# Patient Record
Sex: Female | Born: 1940 | Hispanic: Yes | Marital: Married | Smoking: Never smoker
Health system: Southern US, Community
[De-identification: ages and names within clinical notes are randomized; demographics above are authoritative.]

## PROBLEM LIST (undated history)

## (undated) DIAGNOSIS — F0390 Unspecified dementia without behavioral disturbance: Secondary | ICD-10-CM

---

## 2017-09-01 ENCOUNTER — Emergency Department
Admission: EM | Admit: 2017-09-01 | Discharge: 2017-09-01 | Disposition: A | Discharge status: Home / Self Care | Payer: Medicare Other | Attending: Emergency Medicine | Admitting: Emergency Medicine

## 2017-09-01 ENCOUNTER — Encounter: Payer: Self-pay | Admitting: Emergency Medicine

## 2017-09-01 DIAGNOSIS — R319 Hematuria, unspecified: Secondary | ICD-10-CM

## 2017-09-01 DIAGNOSIS — N390 Urinary tract infection, site not specified: Secondary | ICD-10-CM | POA: Insufficient documentation

## 2017-09-01 LAB — URINALYSIS, COMPLETE (UACMP) WITH MICROSCOPIC
BILIRUBIN URINE: NEGATIVE
Glucose, UA: NEGATIVE mg/dL
Ketones, ur: NEGATIVE mg/dL
Nitrite: NEGATIVE
Protein, ur: NEGATIVE mg/dL
RBC / HPF: >50 RBC/hpf — ABNORMAL HIGH (ref 0–5)
Specific Gravity, Urine: 1.005 (ref 1.005–1.030)
WBC, UA: >50 WBC/hpf — ABNORMAL HIGH (ref 0–5)
pH: 8 (ref 5.0–8.0)

## 2017-09-01 MED ORDER — CEPHALEXIN 500 MG PO CAPS: 500.0000 mg | ORAL_CAPSULE | Freq: Three times a day (TID) | ORAL | 0 refills | Status: AC

## 2017-09-01 MED ORDER — CEPHALEXIN 500 MG PO CAPS
500.0000 mg | ORAL_CAPSULE | Freq: Once | ORAL | Status: AC
  Administered 2017-09-01: 500 mg via ORAL
  Filled 2017-09-01: qty 1

## 2017-09-01 NOTE — ED Provider Notes (Signed)
[facility] Emergency Department Provider Note ____________________________________________   First MD Initiated Contact with Patient 09/01/17 1357     (approximate)  I have reviewed the triage vital signs and the nursing notes.   HISTORY  Chief Complaint Hematuria  HPI Rachel Yang is a 77 y.o. female with a history of high cholesterol as well as kidney stones was presented to the emergency department with 1 day of hematuria as well as burning with urination.  She denies any pain at this time but says that there is burning pain when she urinates.  She does not report fever, nausea or vomiting.  Denies any history of previous UTI.  No history of diabetes.  Says that she just had a small amount of blood on the toilet this yesterday and otherwise has not any further bleeding.  History reviewed. No pertinent past medical history.  There are no active problems to display for this patient.   History reviewed. No pertinent surgical history.  Prior to Admission medications   Not on File    Allergies Patient has no allergy information on record.  No family history on file.  Social History Social History   Tobacco Use  . Smoking status: Not on file  Substance Use Topics  . Alcohol use: Not on file  . Drug use: Not on file    Review of Systems  Constitutional: No fever/chills Eyes: No visual changes. ENT: No sore throat. Cardiovascular: Denies chest pain. Respiratory: Denies shortness of breath. Gastrointestinal:  No nausea, no vomiting.  No diarrhea.  No constipation. Genitourinary: As above  musculoskeletal: Negative for back pain. Skin: Negative for rash. Neurological: Negative for headaches, focal weakness or numbness.   ____________________________________________   PHYSICAL EXAM:  VITAL SIGNS: ED Triage Vitals  Enc Vitals Group     BP 09/01/17 1303 111/72     Pulse Rate 09/01/17 1303 67     Resp 09/01/17 1303 18     Temp  09/01/17 1303 98.6 F (37 C)     Temp Source 09/01/17 1303 Oral     SpO2 09/01/17 1303 98 %     Weight 09/01/17 1304 132 lb (59.9 kg)     Height 09/01/17 1304 5\' 6"  (1.676 m)     Head Circumference --      Peak Flow --      Pain Score 09/01/17 1304 7     Pain Loc --      Pain Edu? --      Excl. in GC? --     Constitutional: Alert and oriented. Well appearing and in no acute distress. Eyes: Conjunctivae are normal.  Head: Atraumatic. Nose: No congestion/rhinnorhea. Mouth/Throat: Mucous membranes are moist.  Neck: No stridor.   Cardiovascular: Normal rate, regular rhythm. Grossly normal heart sounds.  Respiratory: Normal respiratory effort.  No retractions. Lungs CTAB. Gastrointestinal: Soft and nontender. No distention. No CVA tenderness. Musculoskeletal: No lower extremity tenderness nor edema.  No joint effusions. Neurologic:  Normal speech and language. No gross focal neurologic deficits are appreciated. Skin:  Skin is warm, dry and intact. No rash noted. Psychiatric: Mood and affect are normal. Speech and behavior are normal.  ____________________________________________   LABS (all labs ordered are listed, but only abnormal results are displayed)  Labs Reviewed  URINALYSIS, COMPLETE (UACMP) WITH MICROSCOPIC - Abnormal; Notable for the following components:      Result Value   Color, Urine YELLOW (*)    APPearance HAZY (*)    Hgb urine  dipstick LARGE (*)    Leukocytes, UA LARGE (*)    RBC / HPF >50 (*)    WBC, UA >50 (*)    Bacteria, UA RARE (*)    All other components within normal limits  URINE CULTURE   ____________________________________________  EKG   ____________________________________________  RADIOLOGY   ____________________________________________   PROCEDURES  Procedure(s) performed:   Procedures  Critical Care performed:   ____________________________________________   INITIAL IMPRESSION / ASSESSMENT AND PLAN / ED  COURSE  Pertinent labs & imaging results that were available during my care of the patient were reviewed by me and considered in my medical decision making (see chart for details).  Differential diagnosis includes, but is not limited to, ovarian cyst, ovarian torsion, acute appendicitis, diverticulitis, urinary tract infection/pyelonephritis, endometriosis, bowel obstruction, colitis, renal colic, gastroenteritis, hernia, fibroids, endometriosis, pregnancy related pain including ectopic pregnancy, etc. As part of my medical decision making, I reviewed the following data within the electronic MEDICAL RECORD NUMBER Notes from prior ED visits  ----------------------------------------- 2:27 PM on 09/01/2017 -----------------------------------------  Patient at this time updated about results that are positive for UTI.  Patient to be given dose of Keflex and will give be given a prescription for 7 days of 3 times daily Keflex.  She is understanding of the treatment plan as well as diagnosis and willing to comply.  She also understands return for any worsening or concerning symptoms especially increased pain or fever.  Likely bleeding secondary to the UTI.  No back pain.  The patient does not appear to be having difficulty finding a position of comfort.  Do not suspect kidney stone at this time.  Nontoxic.  Not suspecting sepsis ____________________________________________   FINAL CLINICAL IMPRESSION(S) / ED DIAGNOSES  UTI  NEW MEDICATIONS STARTED DURING THIS VISIT:  New Prescriptions   No medications on file     Note:  This document was prepared using Dragon voice recognition software and may include unintentional dictation errors.     Myrna BlazerSchaevitz, Dalesha Stanback Matthew, MD 09/01/17 [phone]

## 2017-09-01 NOTE — ED Notes (Addendum)
Pt reporting she already provided a urine sample. RN called lab and lab denies having a urine sample. Triage RN verbalized she has sent the urine. This RN will call lab back.

## 2017-09-01 NOTE — ED Notes (Signed)
Lab verified the urine was located.

## 2017-09-01 NOTE — ED Triage Notes (Addendum)
PT arrived with family with concerns over blood in urine. Pt reports symptoms started yesterday. Pt states she only is in pain when she pees and it feels like burning.

## 2017-09-03 LAB — URINE CULTURE

## 2018-02-10 ENCOUNTER — Emergency Department
Admission: EM | Admit: 2018-02-10 | Discharge: 2018-02-10 | Disposition: A | Discharge status: Home / Self Care | Payer: Medicare Other | Attending: Emergency Medicine | Admitting: Emergency Medicine

## 2018-02-10 ENCOUNTER — Emergency Department: Payer: Medicare Other

## 2018-02-10 ENCOUNTER — Encounter: Payer: Self-pay | Admitting: Emergency Medicine

## 2018-02-10 DIAGNOSIS — Z23 Encounter for immunization: Secondary | ICD-10-CM | POA: Insufficient documentation

## 2018-02-10 DIAGNOSIS — S7001XA Contusion of right hip, initial encounter: Secondary | ICD-10-CM | POA: Insufficient documentation

## 2018-02-10 DIAGNOSIS — Y999 Unspecified external cause status: Secondary | ICD-10-CM | POA: Insufficient documentation

## 2018-02-10 DIAGNOSIS — W0110XA Fall on same level from slipping, tripping and stumbling with subsequent striking against unspecified object, initial encounter: Secondary | ICD-10-CM | POA: Diagnosis not present

## 2018-02-10 DIAGNOSIS — F0390 Unspecified dementia without behavioral disturbance: Secondary | ICD-10-CM | POA: Insufficient documentation

## 2018-02-10 DIAGNOSIS — S0990XA Unspecified injury of head, initial encounter: Secondary | ICD-10-CM | POA: Diagnosis not present

## 2018-02-10 DIAGNOSIS — Y9301 Activity, walking, marching and hiking: Secondary | ICD-10-CM | POA: Insufficient documentation

## 2018-02-10 DIAGNOSIS — S0181XA Laceration without foreign body of other part of head, initial encounter: Secondary | ICD-10-CM | POA: Diagnosis not present

## 2018-02-10 DIAGNOSIS — W19XXXA Unspecified fall, initial encounter: Secondary | ICD-10-CM

## 2018-02-10 DIAGNOSIS — Y92009 Unspecified place in unspecified non-institutional (private) residence as the place of occurrence of the external cause: Secondary | ICD-10-CM | POA: Insufficient documentation

## 2018-02-10 HISTORY — DX: Unspecified dementia, unspecified severity, without behavioral disturbance, psychotic disturbance, mood disturbance, and anxiety: F03.90

## 2018-02-10 LAB — CBC
HCT: 44.7 % (ref 36.0–46.0)
Hemoglobin: 14 g/dL (ref 12.0–15.0)
MCH: 26.9 pg (ref 26.0–34.0)
MCHC: 31.3 g/dL (ref 30.0–36.0)
MCV: 86 fL (ref 80.0–100.0)
PLATELETS: 286 10*3/uL (ref 150–400)
RBC: 5.2 MIL/uL — ABNORMAL HIGH (ref 3.87–5.11)
RDW: 13.9 % (ref 11.5–15.5)
WBC: 9.7 10*3/uL (ref 4.0–10.5)
nRBC: 0 % (ref 0.0–0.2)

## 2018-02-10 LAB — COMPREHENSIVE METABOLIC PANEL
ALK PHOS: 58 U/L (ref 38–126)
ALT: 20 U/L (ref 0–44)
ANION GAP: 7 (ref 5–15)
AST: 21 U/L (ref 15–41)
Albumin: 4.1 g/dL (ref 3.5–5.0)
BILIRUBIN TOTAL: 0.5 mg/dL (ref 0.3–1.2)
BUN: 15 mg/dL (ref 8–23)
CALCIUM: 9.7 mg/dL (ref 8.9–10.3)
CO2: 26 mmol/L (ref 22–32)
Chloride: 104 mmol/L (ref 98–111)
Creatinine, Ser: 0.93 mg/dL (ref 0.44–1.00)
GFR calc Af Amer: >60 mL/min (ref >60)
GFR, EST NON AFRICAN AMERICAN: 58 mL/min — AB (ref >60)
GLUCOSE: 234 mg/dL — AB (ref 70–99)
Potassium: 3.6 mmol/L (ref 3.5–5.1)
Sodium: 137 mmol/L (ref 135–145)
TOTAL PROTEIN: 7.3 g/dL (ref 6.5–8.1)

## 2018-02-10 LAB — TROPONIN I: Troponin I: <0.03 ng/mL (ref <0.03)

## 2018-02-10 MED ORDER — ACETAMINOPHEN 500 MG PO TABS
1000.0000 mg | ORAL_TABLET | Freq: Once | ORAL | Status: AC
  Administered 2018-02-10: 1000 mg via ORAL
  Filled 2018-02-10: qty 2

## 2018-02-10 MED ORDER — TETANUS-DIPHTH-ACELL PERTUSSIS 5-2.5-18.5 LF-MCG/0.5 IM SUSP
0.5000 mL | Freq: Once | INTRAMUSCULAR | Status: AC
  Administered 2018-02-10: 0.5 mL via INTRAMUSCULAR
  Filled 2018-02-10: qty 0.5

## 2018-02-10 MED ORDER — TRAMADOL HCL 50 MG PO TABS
100.0000 mg | ORAL_TABLET | Freq: Once | ORAL | Status: AC
  Administered 2018-02-10: 100 mg via ORAL
  Filled 2018-02-10: qty 2

## 2018-02-10 MED ORDER — TRAMADOL HCL 50 MG PO TABS: 100.0000 mg | ORAL_TABLET | Freq: Three times a day (TID) | ORAL | 0 refills | Status: DC | PRN

## 2018-02-10 NOTE — ED Triage Notes (Signed)
Pt arrived via EMS from home where pt had mechanical fall over rug this AM, in which it left a 1/2 laceration over the left eye. No blood thinners. Pt has hx/o dementia. Swelling and abrasions to the right forearm.

## 2018-02-10 NOTE — Discharge Instructions (Addendum)

## 2018-02-10 NOTE — ED Provider Notes (Signed)
[facility] Emergency Department Provider Note  ____________________________________________   First MD Initiated Contact with Patient 02/10/18 0125     (approximate)  I have reviewed the triage vital signs and the nursing notes.   HISTORY  Chief Complaint Fall    HPI Rachel Yang is a 78 y.o. female who is generally healthy other than mild dementia and lives at home with her family.  She presents by EMS for evaluation after a fall.  Her daughter reports that the patient was walking and laughing and got distracted, tripped up over her own feet, and fell, striking the center of her forehead between the eyebrows on something hard that caused a laceration.  She is also reporting a significant amount of pain in her right hip which is worse with movement and better at rest.  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  She is not in any distress when remaining still.  She denies neck pain, headache, chest pain, shortness of breath, nausea, vomiting, and abdominal pain.  She reports the pain in the hip is the thing that is bothering her the most, not the head ache or laceration.  She does not know the date of her last tetanus vaccination.  Past Medical History:  Diagnosis Date  . Dementia (HCC)     There are no active problems to display for this patient.   History reviewed. No pertinent surgical history.  Prior to Admission medications   Medication Sig Start Date End Date Taking? Authorizing Provider  traMADol (ULTRAM) 50 MG tablet Take 2 tablets (100 mg total) by mouth every 8 (eight) hours as needed for moderate pain or severe pain. 02/10/18   Loleta Rose, MD    Allergies Patient has no known allergies.  History reviewed. No pertinent family history.  Social History Social History   Tobacco Use  . Smoking status: Never Smoker  . Smokeless tobacco:  Never Used  Substance Use Topics  . Alcohol use: Not on file  . Drug use: Not on file    Review of Systems Constitutional: No fever/chills Eyes: No visual changes. ENT: No sore throat. Cardiovascular: Denies chest pain. Respiratory: Denies shortness of breath. Gastrointestinal: No abdominal pain.  No nausea, no vomiting.  No diarrhea.  No constipation. Genitourinary: Negative for dysuria. Musculoskeletal: Pain in right forearm and right hip after fall Integumentary: Laceration to the medial aspect of left eyebrow Neurological: Negative for headaches, focal weakness or numbness.   ____________________________________________   PHYSICAL EXAM:  VITAL SIGNS: ED Triage Vitals  Enc Vitals Group     BP 02/10/18 0100 121/85     Pulse Rate 02/10/18 0102 80     Resp 02/10/18 0102 17     Temp 02/10/18 0102 98.1 F (36.7 C)     Temp Source 02/10/18 0102 Oral     SpO2 02/10/18 0102 98 %     Weight --      Height --      Head Circumference --      Peak Flow --      Pain Score --      Pain Loc --      Pain Edu? --      Excl. in GC? --     Constitutional: Alert, answering appropriately to my questions, knows her family. Eyes: Conjunctivae are normal. PERRL. EOMI. Head: 1 cm laceration at  medial aspect of left eyebrow Nose: No congestion/rhinnorhea. Mouth/Throat: Mucous membranes are moist. Neck: No stridor.  No meningeal signs.  No cervical spine tenderness to palpation. Cardiovascular: Normal rate, regular rhythm. Good peripheral circulation. Grossly normal heart sounds. Respiratory: Normal respiratory effort.  No retractions. Lungs CTAB. Gastrointestinal: Soft and nontender. No distention.  Musculoskeletal: Tenderness to palpation of the right hip and pain with passive and active movement of the right hip. Neurologic:  Normal speech and language. No gross focal neurologic deficits are appreciated.  Skin:  Skin is warm, dry and intact. No rash noted.  Lipoma is present on the  patient's forearm, nontender. 1 cm laceration at medial aspect of left eyebrow.   ____________________________________________   LABS (all labs ordered are listed, but only abnormal results are displayed)  Labs Reviewed  COMPREHENSIVE METABOLIC PANEL - Abnormal; Notable for the following components:      Result Value   Glucose, Bld 234 (*)    GFR calc non Af Amer 58 (*)    All other components within normal limits  CBC - Abnormal; Notable for the following components:   RBC 5.20 (*)    All other components within normal limits  TROPONIN I   ____________________________________________  EKG  ED ECG REPORT I, Loleta Rose, the attending physician, personally viewed and interpreted this ECG.  Date: 02/10/2018 EKG Time: 1:06 AM Rate: 81 Rhythm: normal sinus rhythm QRS Axis: normal Intervals: normal ST/T Wave abnormalities: Non-specific ST segment / T-wave changes, but no clear evidence of acute ischemia. Narrative Interpretation: no definitive evidence of acute ischemia; does not meet STEMI criteria.   ____________________________________________  RADIOLOGY I, Loleta Rose, personally viewed and evaluated these images (plain radiographs) as part of my medical decision making, as well as reviewing the written report by the radiologist.  ED MD interpretation: No acute injuries to the right hip on plain films nor noncontrast CT.  Forearm radiographs are normal.  CT head and cervical spine also demonstrate no acute injury.  Official radiology report(s): Dg Forearm Right  Result Date: 02/10/2018 CLINICAL DATA:  Status post fall, right forearm pain EXAM: RIGHT FOREARM - 2 VIEW COMPARISON:  None. FINDINGS: There is no evidence of fracture or other focal bone lesions. There is a small well corticated bony fragment adjacent to the coronoid process likely reflecting old ununited fracture. Soft tissues are unremarkable. IMPRESSION: No acute osseous injury of the right forearm.  Electronically Signed   By: Elige Ko   On: 02/10/2018 02:39   Ct Head Wo Contrast  Result Date: 02/10/2018 CLINICAL DATA:  Mechanical fall this morning. Laceration over the left eye. Pain, not, and swelling to the mid frontal area. No blood thinners. EXAM: CT HEAD WITHOUT CONTRAST CT CERVICAL SPINE WITHOUT CONTRAST TECHNIQUE: Multidetector CT imaging of the head and cervical spine was performed following the standard protocol without intravenous contrast. Multiplanar CT image reconstructions of the cervical spine were also generated. COMPARISON:  None. FINDINGS: CT HEAD FINDINGS Brain: Diffuse cerebral atrophy. Ventricular dilatation likely due to central atrophy. Low-attenuation changes in the deep white matter consistent with small vessel ischemia. 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 No mass effect or midline shift. No abnormal extra-axial fluid collections. Gray-white matter junctions are distinct. Basal cisterns are not effaced. No acute intracranial hemorrhage. Vascular: No hyperdense vessel or unexpected calcification. Skull: Calvarium appears intact. No acute displaced fractures identified. Sinuses/Orbits: Paranasal sinuses and mastoid air cells are clear. Other: None. CT CERVICAL SPINE FINDINGS Alignment: Straightening of usual cervical lordosis. This  is likely due to patient positioning but ligamentous injury or muscle spasm could also have this appearance and are not excluded. Slight anterior subluxation of C7 on T1. This is likely degenerative. Normal alignment of the facet joints. C1-2 articulation appears intact. Skull base and vertebrae: Skull base appears intact. No vertebral compression deformities. No focal bone lesion or bone destruction. Bone cortex appears intact. Soft tissues and spinal canal: No prevertebral soft tissue swelling. No abnormal paraspinal soft tissue mass or infiltration. Disc levels: Degenerative changes in the cervical spine with narrowed interspaces and endplate  hypertrophic changes at C4-5, C5-6, and C6-7 levels. Upper chest: Scarring or linear atelectasis in the left apex. Other: None. IMPRESSION: 1. No acute intracranial abnormalities. Chronic atrophy and small vessel ischemic changes. 2. Nonspecific straightening of usual cervical lordosis. Degenerative changes in the cervical spine. No acute displaced fractures identified. Electronically Signed   By: Burman NievesWilliam  Stevens M.D.   On: 02/10/2018 02:27   Ct Cervical Spine Wo Contrast  Result Date: 02/10/2018 CLINICAL DATA:  Mechanical fall this morning. Laceration over the left eye. Pain, not, and swelling to the mid frontal area. No blood thinners. EXAM: CT HEAD WITHOUT CONTRAST CT CERVICAL SPINE WITHOUT CONTRAST TECHNIQUE: Multidetector CT imaging of the head and cervical spine was performed following the standard protocol without intravenous contrast. Multiplanar CT image reconstructions of the cervical spine were also generated. COMPARISON:  None. FINDINGS: CT HEAD FINDINGS Brain: Diffuse cerebral atrophy. Ventricular dilatation likely due to central atrophy. Low-attenuation changes in the deep white matter consistent with small vessel ischemia. 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 No mass effect or midline shift. No abnormal extra-axial fluid collections. Gray-white matter junctions are distinct. Basal cisterns are not effaced. No acute intracranial hemorrhage. Vascular: No hyperdense vessel or unexpected calcification. Skull: Calvarium appears intact. No acute displaced fractures identified. Sinuses/Orbits: Paranasal sinuses and mastoid air cells are clear. Other: None. CT CERVICAL SPINE FINDINGS Alignment: Straightening of usual cervical lordosis. This is likely due to patient positioning but ligamentous injury or muscle spasm could also have this appearance and are not excluded. Slight anterior subluxation of C7 on T1. This is likely degenerative. Normal alignment of the facet joints. C1-2 articulation appears  intact. Skull base and vertebrae: Skull base appears intact. No vertebral compression deformities. No focal bone lesion or bone destruction. Bone cortex appears intact. Soft tissues and spinal canal: No prevertebral soft tissue swelling. No abnormal paraspinal soft tissue mass or infiltration. Disc levels: Degenerative changes in the cervical spine with narrowed interspaces and endplate hypertrophic changes at C4-5, C5-6, and C6-7 levels. Upper chest: Scarring or linear atelectasis in the left apex. Other: None. IMPRESSION: 1. No acute intracranial abnormalities. Chronic atrophy and small vessel ischemic changes. 2. Nonspecific straightening of usual cervical lordosis. Degenerative changes in the cervical spine. No acute displaced fractures identified. Electronically Signed   By: Burman NievesWilliam  Stevens M.D.   On: 02/10/2018 02:27   Ct Hip Right Wo Contrast  Result Date: 02/10/2018 CLINICAL DATA:  Right hip trauma. Status post fall. Right hip pain. EXAM: CT OF THE RIGHT HIP WITHOUT CONTRAST TECHNIQUE: Multidetector CT imaging of the right hip was performed according to the standard protocol. Multiplanar CT image reconstructions were also generated. COMPARISON:  None. FINDINGS: Bones/Joint/Cartilage No fracture or dislocation. Normal alignment. No joint effusion. No periosteal reaction or bone destruction. Mild generalized osteopenia. Minimal right hip joint space narrowing. Ligaments Ligaments are suboptimally evaluated by CT. Muscles and Tendons Muscles are normal. No muscle atrophy. No intramuscular fluid collection.  Soft tissue No fluid collection or hematoma.  No soft tissue mass. IMPRESSION: 1.  No acute osseous injury of the right hip. Electronically Signed   By: Elige Ko   On: 02/10/2018 03:39   Dg Hip Unilat W Or Wo Pelvis 2-3 Views Right  Result Date: 02/10/2018 CLINICAL DATA:  Mechanical fall. Right hip pain. EXAM: DG HIP (WITH OR WITHOUT PELVIS) 2-3V RIGHT COMPARISON:  None. FINDINGS: Pelvis and right  hip appear intact. No acute fracture or dislocation. No focal bone lesion or bone destruction. Bone cortex appears intact. Mild degenerative changes in the right hip. Soft tissues are unremarkable. IMPRESSION: Mild degenerative changes in the right hip. No acute bony abnormalities. Electronically Signed   By: Burman Nieves M.D.   On: 02/10/2018 02:39    ____________________________________________   PROCEDURES  Critical Care performed: No   Procedure(s) performed:   Marland KitchenMarland KitchenLaceration Repair Date/Time: 02/10/2018 4:16 AM Performed by: Loleta Rose, MD Authorized by: Loleta Rose, MD   Consent:    Consent obtained:  Verbal   Consent given by:  Guardian and patient   Risks discussed:  Infection, pain, need for additional repair, poor cosmetic result and poor wound healing Anesthesia (see MAR for exact dosages):    Anesthesia method:  None Laceration details:    Location:  Face   Face location:  Forehead   Length (cm):  1 Repair type:    Repair type:  Simple Exploration:    Contaminated: no   Treatment:    Amount of cleaning:  Standard   Irrigation solution:  Sterile saline   Visualized foreign bodies/material removed: no   Skin repair:    Repair method:  Tissue adhesive and Steri-Strips Approximation:    Approximation:  Close Post-procedure details:    Dressing:  Open (no dressing)   Patient tolerance of procedure:  Tolerated well, no immediate complications     ____________________________________________   INITIAL IMPRESSION / ASSESSMENT AND PLAN / ED COURSE  As part of my medical decision making, I reviewed the following data within the electronic MEDICAL RECORD NUMBER History obtained from family, Nursing notes reviewed and incorporated, Labs reviewed , EKG interpreted , Old chart reviewed, Radiograph reviewed  and Notes from prior ED visits    Differential diagnosis includes, but is not limited to, contusions after mechanical fall, cardiogenic syncope, hip  fracture/dislocation, laceration, acute intracranial bleeding, skull fracture, cervical spine fracture.  The patient is quite well-appearing and only hurts when she moves around her head.  After the initial negative radiographs, I obtained a CT hip without contrast for further evaluation.  I feel like this would be an adequate and appropriate study; even though MRI has greater sensitivity it takes much longer both to obtain and it requires the patient to be still for much longer which I do not think she will be able to do particularly given her dementia and some difficulty following instructions.  Fortunately all the results were negative for acute injury and I think she is suffering from contusion as well as an exacerbation of her chronic pain.  Both of her adult children report that she frequently complains of pain in her right hip and that she has osteoporosis.    I offered to place sutures in the laceration but I think we can achieve adequate closure with Steri-Strips and Dermabond and the family and the patient prefer that option.  It was successful and I given them my usual customary laceration follow-up recommendations.  They are comfortable taking the patient  home and I gave my usual customary follow-up and return precautions.     ____________________________________________  FINAL CLINICAL IMPRESSION(S) / ED DIAGNOSES  Final diagnoses:  Fall in home, initial encounter  Minor head injury, initial encounter  Laceration of forehead, initial encounter  Contusion of right hip, initial encounter     MEDICATIONS GIVEN DURING THIS VISIT:  Medications  traMADol (ULTRAM) tablet 100 mg (100 mg Oral Given 02/10/18 0345)  acetaminophen (TYLENOL) tablet 1,000 mg (1,000 mg Oral Given 02/10/18 0345)  Tdap (BOOSTRIX) injection 0.5 mL (0.5 mLs Intramuscular Given 02/10/18 0410)     ED Discharge Orders         Ordered    traMADol (ULTRAM) 50 MG tablet  Every 8 hours PRN     02/10/18 0443             Note:  This document was prepared using Dragon voice recognition software and may include unintentional dictation errors.   Loleta RoseForbach, Atticus Wedin, MD 02/10/18 [phone]

## 2019-11-16 ENCOUNTER — Other Ambulatory Visit: Payer: Self-pay

## 2019-11-16 ENCOUNTER — Emergency Department: Payer: Medicare (Managed Care)

## 2019-11-16 ENCOUNTER — Observation Stay: Payer: Medicare (Managed Care)

## 2019-11-16 ENCOUNTER — Encounter: Payer: Self-pay | Admitting: Radiology

## 2019-11-16 ENCOUNTER — Observation Stay
Admission: EM | Admit: 2019-11-16 | Discharge: 2019-11-18 | Disposition: A | Discharge status: Home / Self Care | Payer: Medicare (Managed Care) | Attending: Internal Medicine | Admitting: Internal Medicine

## 2019-11-16 DIAGNOSIS — E1169 Type 2 diabetes mellitus with other specified complication: Secondary | ICD-10-CM

## 2019-11-16 DIAGNOSIS — R42 Dizziness and giddiness: Secondary | ICD-10-CM | POA: Diagnosis not present

## 2019-11-16 DIAGNOSIS — Z7984 Long term (current) use of oral hypoglycemic drugs: Secondary | ICD-10-CM | POA: Insufficient documentation

## 2019-11-16 DIAGNOSIS — F0390 Unspecified dementia without behavioral disturbance: Secondary | ICD-10-CM | POA: Insufficient documentation

## 2019-11-16 DIAGNOSIS — R4182 Altered mental status, unspecified: Secondary | ICD-10-CM | POA: Insufficient documentation

## 2019-11-16 DIAGNOSIS — I672 Cerebral atherosclerosis: Secondary | ICD-10-CM | POA: Insufficient documentation

## 2019-11-16 DIAGNOSIS — R4701 Aphasia: Principal | ICD-10-CM | POA: Insufficient documentation

## 2019-11-16 DIAGNOSIS — I517 Cardiomegaly: Secondary | ICD-10-CM | POA: Insufficient documentation

## 2019-11-16 DIAGNOSIS — R2990 Unspecified symptoms and signs involving the nervous system: Secondary | ICD-10-CM | POA: Diagnosis present

## 2019-11-16 DIAGNOSIS — E119 Type 2 diabetes mellitus without complications: Secondary | ICD-10-CM | POA: Insufficient documentation

## 2019-11-16 DIAGNOSIS — Z20822 Contact with and (suspected) exposure to covid-19: Secondary | ICD-10-CM | POA: Diagnosis not present

## 2019-11-16 DIAGNOSIS — I700 Atherosclerosis of aorta: Secondary | ICD-10-CM | POA: Diagnosis not present

## 2019-11-16 DIAGNOSIS — E785 Hyperlipidemia, unspecified: Secondary | ICD-10-CM | POA: Diagnosis not present

## 2019-11-16 DIAGNOSIS — R410 Disorientation, unspecified: Secondary | ICD-10-CM | POA: Diagnosis present

## 2019-11-16 LAB — CBC
HCT: 45.1 % (ref 36.0–46.0)
Hemoglobin: 14.7 g/dL (ref 12.0–15.0)
MCH: 27.6 pg (ref 26.0–34.0)
MCHC: 32.6 g/dL (ref 30.0–36.0)
MCV: 84.6 fL (ref 80.0–100.0)
Platelets: 285 10*3/uL (ref 150–400)
RBC: 5.33 MIL/uL — ABNORMAL HIGH (ref 3.87–5.11)
RDW: 13.9 % (ref 11.5–15.5)
WBC: 10.8 10*3/uL — ABNORMAL HIGH (ref 4.0–10.5)
nRBC: 0 % (ref 0.0–0.2)

## 2019-11-16 LAB — URINALYSIS, COMPLETE (UACMP) WITH MICROSCOPIC
Bacteria, UA: NONE SEEN
Bilirubin Urine: NEGATIVE
Glucose, UA: NEGATIVE mg/dL
Hgb urine dipstick: NEGATIVE
Ketones, ur: NEGATIVE mg/dL
Leukocytes,Ua: NEGATIVE
Nitrite: NEGATIVE
Protein, ur: NEGATIVE mg/dL
Specific Gravity, Urine: 1.039 — ABNORMAL HIGH (ref 1.005–1.030)
pH: 6 (ref 5.0–8.0)

## 2019-11-16 LAB — RESPIRATORY PANEL BY RT PCR (FLU A&B, COVID)
Influenza A by PCR: NEGATIVE
Influenza B by PCR: NEGATIVE
SARS Coronavirus 2 by RT PCR: NEGATIVE

## 2019-11-16 LAB — I-STAT CREATININE, ED: Creatinine, Ser: 0.7 mg/dL (ref 0.44–1.00)

## 2019-11-16 LAB — COMPREHENSIVE METABOLIC PANEL
ALT: 20 U/L (ref 0–44)
AST: 29 U/L (ref 15–41)
Albumin: 3.8 g/dL (ref 3.5–5.0)
Alkaline Phosphatase: 74 U/L (ref 38–126)
Anion gap: 11 (ref 5–15)
BUN: 14 mg/dL (ref 8–23)
CO2: 23 mmol/L (ref 22–32)
Calcium: 9 mg/dL (ref 8.9–10.3)
Chloride: 99 mmol/L (ref 98–111)
Creatinine, Ser: 0.71 mg/dL (ref 0.44–1.00)
GFR, Estimated: >60 mL/min (ref >60)
Glucose, Bld: 156 mg/dL — ABNORMAL HIGH (ref 70–99)
Potassium: 4.4 mmol/L (ref 3.5–5.1)
Sodium: 133 mmol/L — ABNORMAL LOW (ref 135–145)
Total Bilirubin: 1 mg/dL (ref 0.3–1.2)
Total Protein: 7.7 g/dL (ref 6.5–8.1)

## 2019-11-16 LAB — HEMOGLOBIN A1C
Hgb A1c MFr Bld: 7.5 % — ABNORMAL HIGH (ref 4.8–5.6)
Mean Plasma Glucose: 168.55 mg/dL

## 2019-11-16 LAB — DIFFERENTIAL
Abs Immature Granulocytes: 0.04 10*3/uL (ref 0.00–0.07)
Basophils Absolute: 0 10*3/uL (ref 0.0–0.1)
Basophils Relative: 0 %
Eosinophils Absolute: 0 10*3/uL (ref 0.0–0.5)
Eosinophils Relative: 0 %
Immature Granulocytes: 0 %
Lymphocytes Relative: 8 %
Lymphs Abs: 0.9 10*3/uL (ref 0.7–4.0)
Monocytes Absolute: 0.4 10*3/uL (ref 0.1–1.0)
Monocytes Relative: 4 %
Neutro Abs: 9.4 10*3/uL — ABNORMAL HIGH (ref 1.7–7.7)
Neutrophils Relative %: 88 %

## 2019-11-16 LAB — PROTIME-INR
INR: 0.9 (ref 0.8–1.2)
Prothrombin Time: 12.2 seconds (ref 11.4–15.2)

## 2019-11-16 LAB — OSMOLALITY: Osmolality: 291 mOsm/kg (ref 275–295)

## 2019-11-16 LAB — APTT: aPTT: 30 seconds (ref 24–36)

## 2019-11-16 LAB — OSMOLALITY, URINE: Osmolality, Ur: 487 mOsm/kg (ref 300–900)

## 2019-11-16 LAB — TSH: TSH: 3.636 u[IU]/mL (ref 0.350–4.500)

## 2019-11-16 LAB — CBG MONITORING, ED: Glucose-Capillary: 182 mg/dL — ABNORMAL HIGH (ref 70–99)

## 2019-11-16 LAB — AMMONIA: Ammonia: 17 umol/L (ref 9–35)

## 2019-11-16 MED ORDER — SODIUM CHLORIDE 0.9% FLUSH: 3.0000 mL | Freq: Once | INTRAVENOUS | Status: DC

## 2019-11-16 MED ORDER — ASPIRIN 81 MG PO CHEW
324.0000 mg | CHEWABLE_TABLET | Freq: Once | ORAL | Status: AC
  Administered 2019-11-16: 324 mg via ORAL
  Filled 2019-11-16: qty 4

## 2019-11-16 MED ORDER — INSULIN ASPART 100 UNIT/ML ~~LOC~~ SOLN: 0.0000 [IU] | Freq: Three times a day (TID) | SUBCUTANEOUS | Status: DC

## 2019-11-16 MED ORDER — IOHEXOL 350 MG/ML SOLN
75.0000 mL | Freq: Once | INTRAVENOUS | Status: AC | PRN
  Administered 2019-11-16: 75 mL via INTRAVENOUS

## 2019-11-16 NOTE — ED Notes (Signed)
Report called to floor RN

## 2019-11-16 NOTE — ED Triage Notes (Signed)
Pt comes into the ED via POV with her son c/o dizziness and confusion that started at 11:00 am this morning.  Per pt's son, she woke up like normal and drank her coffee, but then told her husband around 11:00 this morning that she was having dizziness.  Pt is currently having confusion with questions being asked.  PT does have h/o dementia at baseline, but per son, this is not her current baseline.

## 2019-11-16 NOTE — ED Notes (Signed)
First look provided by Fanny Bien, MD

## 2019-11-16 NOTE — ED Triage Notes (Signed)
First Nurse Note:  Arrives with son who states patient has history of stroke 3 years ago.  Went to bed last night feeling well, bed at 2300, awoke this morning c/o dizziness, being off balance, and difficulty speaking.  Patient in awake and alert. Unsteady on feet.  Placed in wheelchair for safety.  Son to stay with patient.

## 2019-11-16 NOTE — H&P (Signed)
[facility] History and Physical   Patient: Rachel Yang WUJ:[id number]   PCP: Patient, No Pcp Per DOB: 12/27/1940   DOA: 11/16/2019   DOS: 11/16/2019   DOS: the patient was seen and examined on 11/16/2019   Patient coming from: The patient is coming from Home  Chief Complaint: Dizzy and aphasia  HPI: Rachel Yang is a 79 y.o. 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  Patient speaks Spanish, patient's son at bedside who helped with translation.  ED physician chart reviewed, patient woke up in the morning time she was not feeling fine she felt something wrong and she was feeling dizzy and later on she found to have some aphasia as well talking to her husband.  Patient was feeling little bit of lightheadedness and dizziness but all the symptoms has been resolved.  Patient denied any focal deficit, no sensory deficit, no vision changes.  Denied any palpitations, no chest pain, no shortness of breath.  Currently patient is back to her baseline.    ED Course: Mild hyponatremia sodium 133, mildly elevated WBC count 10.8 could be reactive leukocytosis mild hyperglycemia blood glucose 156, TSH 3.6 with normal range, Covid and flu negative. CT head and neck angiogram, no acute findings, see complete impression below IMPRESSION: 1. No large vessel occlusion, but there is moderate to severe intracranial atherosclerosis with: - Severe Right MCA M1 stenosis over a segment of 6 mm (series 7, image 43). - Moderate Left PCA P2, and moderate to severe bilateral distal PCA branch stenoses. - up to moderate Left MCA M2 stenoses. 2. Generalized arterial tortuosity. Little to no atherosclerosis in the neck.    Review of Systems: as mentioned in the history of  present illness.  All other systems reviewed and are negative.  Past Medical History:  Diagnosis Date  . Dementia (HCC)    No past surgical history on file. Social History:  reports that she has never smoked. She has never used smokeless tobacco. No history on file for alcohol use and drug use.  No Known Allergies   Family history reviewed and not pertinent No family history on file.   Prior to Admission medications   Medication Sig Start Date End Date Taking? Authorizing Provider  Aspirin Buf,CaCarb-MgCarb-MgO, 81 MG TABS Take 81 mg by mouth daily.   Yes [provider]  Cholecalciferol 25 MCG (1000 UT) capsule Take 1,000 Units by mouth daily.   Yes [provider]    Physical Exam: Vitals:   11/16/19 1435 11/16/19 1600 11/16/19 1630 11/16/19 1700  BP: 121/80 107/61 122/74 (!) 128/92  Pulse: (!) 114 (!) 107 (!) 104 (!) 106  Resp: Temp: 97.8 F (36.6 C)     TempSrc: Oral     SpO2: 97% 94% 90% 92%    General: alert and oriented to time, place, and person. Appear in no distress, affect appropriate Eyes: PERRLA, Conjunctiva normal ENT: Oral Mucosa Clear, moist  Neck: no JVD, no Abnormal Mass Or lumps Cardiovascular: S1 and S2 Present, no Murmur, peripheral pulses symmetrical Respiratory: good respiratory effort, Bilateral Air entry equal and Decreased, no signs of accessory muscle use, Clear to Auscultation, no Crackles, no wheezes Abdomen: Bowel Sound present, Soft and no tenderness, no hernia Skin: no rashes  Extremities: no Pedal edema, no calf tenderness Neurologic: without any new focal findings Gait not checked due to patient safety concerns  Data Reviewed: I have personally reviewed and interpreted labs, imaging as discussed below.  CBC: Recent Labs  Lab 11/16/19 1545  WBC 10.8*  NEUTROABS 9.4*  HGB 14.7  HCT 45.1  MCV 84.6  PLT 285   Basic Metabolic Panel: Recent Labs  Lab 11/16/19 1506 11/16/19 1545  NA  --  133*    K  --  4.4  CL  --  99  CO2  --  23  GLUCOSE  --  156*  BUN  --  14  CREATININE 0.70 0.71  CALCIUM  --  9.0   GFR: CrCl cannot be calculated (Unknown ideal weight.). Liver Function Tests: Recent Labs  Lab 11/16/19 1545  AST 29  ALT 20  ALKPHOS 74  BILITOT 1.0  PROT 7.7  ALBUMIN 3.8   No results for input(s): LIPASE, AMYLASE in the last 168 hours. Recent Labs  Lab 11/16/19 1648  AMMONIA 17   Coagulation Profile: Recent Labs  Lab 11/16/19 1545  INR 0.9   Cardiac Enzymes: No results for input(s): CKTOTAL, CKMB, CKMBINDEX, TROPONINI in the last 168 hours. BNP (last 3 results) No results for input(s): PROBNP in the last 8760 hours. HbA1C: No results for input(s): HGBA1C in the last 72 hours. CBG: Recent Labs  Lab 11/16/19 1440  GLUCAP 182*   Lipid Profile: No results for input(s): CHOL, HDL, LDLCALC, TRIG, CHOLHDL, LDLDIRECT in the last 72 hours. Thyroid Function Tests: Recent Labs    11/16/19 1648  TSH 3.636   Anemia Panel: No results for input(s): VITAMINB12, FOLATE, FERRITIN, TIBC, IRON, RETICCTPCT in the last 72 hours. Urine analysis:    Component Value Date/Time   COLORURINE YELLOW (A) 09/01/2017 1307   APPEARANCEUR HAZY (A) 09/01/2017 1307   LABSPEC 1.005 09/01/2017 1307   PHURINE 8.0 09/01/2017 1307   GLUCOSEU NEGATIVE 09/01/2017 1307   HGBUR LARGE (A) 09/01/2017 1307   BILIRUBINUR NEGATIVE 09/01/2017 1307   KETONESUR NEGATIVE 09/01/2017 1307   PROTEINUR NEGATIVE 09/01/2017 1307   NITRITE NEGATIVE 09/01/2017 1307   LEUKOCYTESUR LARGE (A) 09/01/2017 1307    Radiological Exams on Admission: CT Code Stroke CTA Head W/WO contrast  Addendum Date: 11/16/2019   ADDENDUM REPORT: 11/16/2019 15:43 ADDENDUM: Study discussed by telephone with Dr. Ritta Slot on 11/16/2019 at 1538 hours. Electronically Signed   By: Odessa Fleming M.D.   On: 11/16/2019 15:43   Result Date: 11/16/2019 CLINICAL DATA:  79 year old female code stroke presentation with  dizziness and confusion. EXAM: CT ANGIOGRAPHY HEAD AND NECK TECHNIQUE: Multidetector CT imaging of the head and neck was performed using the standard protocol during bolus administration of intravenous contrast. Multiplanar CT image reconstructions and MIPs were obtained to evaluate the vascular anatomy. Carotid stenosis measurements (when applicable) are obtained utilizing NASCET criteria, using the distal internal carotid diameter as the denominator. CONTRAST:  75mL OMNIPAQUE IOHEXOL 350 MG/ML SOLN COMPARISON:  Plain head CT 1448 hours today. FINDINGS: CTA NECK Skeleton: No acute osseous abnormality identified. Lower cervical disc and endplate degeneration. Mild degeneration in the spine for age overall. Upper chest: Mild atelectasis and/or scarring in the medial left upper lobe. No superior mediastinal lymphadenopathy. Other neck: No acute findings. Aortic arch: 3 vessel arch configuration. Tortuous distal arch with mild to moderate atherosclerosis. Right carotid system: Negative aside from tortuosity. No plaque or stenosis to the skull base. Left carotid system: Minimal plaque at the  left CCA origin without stenosis. Tortuous left CCA and ICA similar to the right side. No stenosis. Vertebral arteries: Tortuous proximal right subclavian artery. Normal right vertebral artery origin. Tortuous right V1 segment. The right vertebral is non dominant. Tortuous right V2 and V3 segments but no plaque or stenosis to the skull base. Tortuous proximal right subclavian artery. Normal left vertebral artery origin on series 6, image 243 with tortuous left V1 segment. Dominant, tortuous left vertebral artery without plaque or stenosis to the skull base. CTA HEAD Posterior circulation: Dominant left vertebral artery is tortuous. Non dominant right vertebral artery is diminutive beyond the PICA but remains patent to the vertebrobasilar junction. Normal PICA origins. Patent basilar artery with tortuosity and mild irregularity but  no stenosis (series 10 image 19). Patent SCA and right PCA origins. Fetal type left PCA origin with tortuous P1 segment. Moderate irregularity and stenosis of the left PCA P2 and P3 segments (series 11, images 32 and 33). Severe left P4 stenosis (image 32). Contralateral mild right P1 and P2 but similar moderate to severe distal right PCA branch irregularity and stenosis (series 11, image 26). Anterior circulation: Both ICA siphons are patent. The left is dominant and dolichoectatic. Only mild siphon calcified plaque occurs without significant stenosis. Normal ophthalmic and left posterior communicating artery origins. Patent left carotid terminus with normal left MCA and ACA origins. The right A1 is diminutive or absent. Anterior communicating artery and bilateral ACA branches are patent with mild irregularity. Left MCA M1 segment is mildly irregular. Patent left MCA bifurcation with mild-to-moderate left M2 and M3 irregularity and stenosis. Contralateral right MCA M1 is severely stenotic over a segment of 6 mm just beyond its origin (series 7, image 43) and this more resembles severe atherosclerosis than thromboembolic disease (see series 9, image 123). Despite this the vessel remains patent to the bifurcation. At the bifurcation only mild stenosis occurs. Right MCA branches enhance and a relatively symmetric fashion. Venous sinuses: Early contrast timing, superior sagittal sinus is patent. Anatomic variants: Dominant left vertebral artery. Dominant left and diminutive or absent right ACA A1 segments. Fetal left PCA origin. Review of the MIP images confirms the above findings IMPRESSION: 1. No large vessel occlusion, but there is moderate to severe intracranial atherosclerosis with: - Severe Right MCA M1 stenosis over a segment of 6 mm (series 7, image 43). - Moderate Left PCA P2, and moderate to severe bilateral distal PCA branch stenoses. - up to moderate Left MCA M2 stenoses. 2. Generalized arterial tortuosity.  Little to no atherosclerosis in the neck. Electronically Signed: By: Odessa FlemingH  Hall M.D. On: 11/16/2019 15:35   CT Code Stroke CTA Neck W/WO contrast  Addendum Date: 11/16/2019   ADDENDUM REPORT: 11/16/2019 15:43 ADDENDUM: Study discussed by telephone with Dr. Ritta SlotMCNEILL KIRKPATRICK on 11/16/2019 at 1538 hours. Electronically Signed   By: Odessa FlemingH  Hall M.D.   On: 11/16/2019 15:43   Result Date: 11/16/2019 CLINICAL DATA:  79 year old female code stroke presentation with dizziness and confusion. EXAM: CT ANGIOGRAPHY HEAD AND NECK TECHNIQUE: Multidetector CT imaging of the head and neck was performed using the standard protocol during bolus administration of intravenous contrast. Multiplanar CT image reconstructions and MIPs were obtained to evaluate the vascular anatomy. Carotid stenosis measurements (when applicable) are obtained utilizing NASCET criteria, using the distal internal carotid diameter as the denominator. CONTRAST:  75mL OMNIPAQUE IOHEXOL 350 MG/ML SOLN COMPARISON:  Plain head CT 1448 hours today. FINDINGS: CTA NECK Skeleton: No acute osseous abnormality identified. Lower cervical disc and  endplate degeneration. Mild degeneration in the spine for age overall. Upper chest: Mild atelectasis and/or scarring in the medial left upper lobe. No superior mediastinal lymphadenopathy. Other neck: No acute findings. Aortic arch: 3 vessel arch configuration. Tortuous distal arch with mild to moderate atherosclerosis. Right carotid system: Negative aside from tortuosity. No plaque or stenosis to the skull base. Left carotid system: Minimal plaque at the left CCA origin without stenosis. Tortuous left CCA and ICA similar to the right side. No stenosis. Vertebral arteries: Tortuous proximal right subclavian artery. Normal right vertebral artery origin. Tortuous right V1 segment. The right vertebral is non dominant. Tortuous right V2 and V3 segments but no plaque or stenosis to the skull base. Tortuous proximal right subclavian  artery. Normal left vertebral artery origin on series 6, image 243 with tortuous left V1 segment. Dominant, tortuous left vertebral artery without plaque or stenosis to the skull base. CTA HEAD Posterior circulation: Dominant left vertebral artery is tortuous. Non dominant right vertebral artery is diminutive beyond the PICA but remains patent to the vertebrobasilar junction. Normal PICA origins. Patent basilar artery with tortuosity and mild irregularity but no stenosis (series 10 image 19). Patent SCA and right PCA origins. Fetal type left PCA origin with tortuous P1 segment. Moderate irregularity and stenosis of the left PCA P2 and P3 segments (series 11, images 32 and 33). Severe left P4 stenosis (image 32). Contralateral mild right P1 and P2 but similar moderate to severe distal right PCA branch irregularity and stenosis (series 11, image 26). Anterior circulation: Both ICA siphons are patent. The left is dominant and dolichoectatic. Only mild siphon calcified plaque occurs without significant stenosis. Normal ophthalmic and left posterior communicating artery origins. Patent left carotid terminus with normal left MCA and ACA origins. The right A1 is diminutive or absent. Anterior communicating artery and bilateral ACA branches are patent with mild irregularity. Left MCA M1 segment is mildly irregular. Patent left MCA bifurcation with mild-to-moderate left M2 and M3 irregularity and stenosis. Contralateral right MCA M1 is severely stenotic over a segment of 6 mm just beyond its origin (series 7, image 43) and this more resembles severe atherosclerosis than thromboembolic disease (see series 9, image 123). Despite this the vessel remains patent to the bifurcation. At the bifurcation only mild stenosis occurs. Right MCA branches enhance and a relatively symmetric fashion. Venous sinuses: Early contrast timing, superior sagittal sinus is patent. Anatomic variants: Dominant left vertebral artery. Dominant left and  diminutive or absent right ACA A1 segments. Fetal left PCA origin. Review of the MIP images confirms the above findings IMPRESSION: 1. No large vessel occlusion, but there is moderate to severe intracranial atherosclerosis with: - Severe Right MCA M1 stenosis over a segment of 6 mm (series 7, image 43). - Moderate Left PCA P2, and moderate to severe bilateral distal PCA branch stenoses. - up to moderate Left MCA M2 stenoses. 2. Generalized arterial tortuosity. Little to no atherosclerosis in the neck. Electronically Signed: By: Odessa Fleming M.D. On: 11/16/2019 15:35   DG Chest Portable 1 View  Result Date: 11/16/2019 CLINICAL DATA:  Dizziness and confusion since 11 a.m. EXAM: PORTABLE CHEST 1 VIEW COMPARISON:  None. FINDINGS: Single frontal view of the chest demonstrates an unremarkable cardiac silhouette. There is ectasia of the thoracic aorta. No airspace disease, effusion, or pneumothorax. No acute bony abnormality. IMPRESSION: 1. No acute intrathoracic process. Electronically Signed   By: Sharlet Salina M.D.   On: 11/16/2019 15:52   CT HEAD CODE STROKE WO CONTRAST  Result Date: 11/16/2019 CLINICAL DATA:  Code stroke. Neuro deficit, acute stroke suspected. Dizziness and confusion. EXAM: CT HEAD WITHOUT CONTRAST TECHNIQUE: Contiguous axial images were obtained from the base of the skull through the vertex without intravenous contrast. COMPARISON:  None. FINDINGS: Brain: No evidence of acute large vascular territory infarction, hemorrhage, extra-axial collection or mass lesion/mass effect. Ventriculomegaly, which is slightly out of proportion to the degree of cerebral volume loss. Additionally, there is an acute callosal angle and mild crowding of sulci at the vertex. Remote appearing infarcts in bilateral basal ganglia. Patchy white matter hypodensities, most likely the sequela of chronic microvascular ischemic disease. Diffuse cerebral atrophy. Vascular: Calcific atherosclerosis. No convincing hyperdense  vessel. Mild hyperdensity of the basilar is favored to secondary to streak artifact in this region. Skull: No acute fracture. Sinuses/Orbits: No acute findings. Other: No mastoid effusions. ASPECTS [facility] Stroke Program Early CT Score) total score (0-10 with 10 being normal): 10 IMPRESSION: 1. No evidence of acute intracranial abnormality.  ASPECTS is 10. 2. Remote appearing lacunar infarcts in bilateral basal ganglia. 3. Ventriculomegaly, which is slightly out of proportion to the degree of cerebral volume loss. Additionally, there is an acute callosal angle and mild crowding of sulci at the vertex. Overall, findings are nonspecific but can be seen with normal pressure hydrocephalus in the correct clinical setting. Code stroke imaging results were communicated on 11/16/2019 at 2:59 pm to provider Dr. Fanny Bien Via telephone, who verbally acknowledged these results. Electronically Signed   By: Feliberto Harts MD   On: 11/16/2019 15:04     I reviewed all nursing notes, pharmacy notes, vitals, pertinent old records.  Assessment/Plan  Active Problems:   Stroke-like symptom    # Aphasia and dizziness, presented with strokelike symptoms Neurology consulted, patient is not a TPA candidate CT angiogram head and neck ruled out acute stroke, shows severe atherosclerosis, see complete report above Patient was given aspirin 324 mg in the ED Started aspirin 325 mg p.o. daily as per neurology recommendation Continue to monitor on telemetry Continue neuro check as per protocol Follow MRI brain, patient will need EEG if MRI negative as per neurology Follow 2D echocardiogram TSH within normal range, follow A1c and lipid profile Follow SLP, PT/OT eval before discharge   # History of CVA and HLD, patient takes aspirin 81 mg pod, but no statin at home Patient does not have any residual deficit due to history of prior CVA Check a lipid profile  # NIDDM T2, patient is not on any medication continue diabetic  diet and started Humalog sliding scale, monitor FSBG Check hemoglobin A1c   # History of depression, but patient is not on any medication Continue supportive care    Nutrition: Carb modified diet DVT Prophylaxis: Subcutaneous Lovenox  Advance goals of care discussion: Full code   Consults: Neurology consulted   Family Communication: family was present at bedside, at the time of interview.  Opportunity was given to ask question and all questions were answered satisfactorily.  Disposition: Admitted as observation, med-surge unit. Likely to be discharged home, in 1-2 days.  I have discussed plan of care as described above with RN and patient/family.  Severity of Illness: The appropriate patient status for this patient is OBSERVATION. Observation status is judged to be reasonable and necessary in order to provide the required intensity of service to ensure the patient's safety. The patient's presenting symptoms, physical exam findings, and initial radiographic and laboratory data in the context of their medical condition is felt to  place them at decreased risk for further clinical deterioration. Furthermore, it is anticipated that the patient will be medically stable for discharge from the hospital within 2 midnights of admission. The following factors support the patient status of observation.   " The patient's presenting symptoms include aphasia and dizziness. " The physical exam findings include: symptoms resolved. " The initial radiographic and laboratory data are severe intracranial atherosclerosis, needs complete stroke work-up, MRI, EEG and 2D echocardiogram.     Author: Gillis Santa, MD [facility] 11/16/2019 6:38 PM   To reach On-call, see care teams to locate the attending and reach out to them via [email or web address]. If 7PM-7AM, please contact night-coverage If you still have difficulty reaching the attending provider, please page the [facility] (Director on Call) for [facility] on amion for assistance.

## 2019-11-16 NOTE — ED Provider Notes (Signed)
[facility] Emergency Department Provider Note    First MD Initiated Contact with Patient 11/16/19 1501     (approximate)  I have reviewed the triage vital signs and the nursing notes.   HISTORY  Chief Complaint Dizziness    HPI Rachel Yang is a 79 y.o. female with the below listed past medical history presents to the ER for evaluation of confusion and difficulty finding words and understanding family members that started around 11:00.  Does have a documented history of dementia and reportedly has a history of stroke.  She is not on any anticoagulation.  No history of GI bleeds no recent surgeries.  Patient was made a code stroke upon arrival.  Most of the history is obtained from patient's son who states that she woke up completely normal and behaving normally this morning.  Reported was talking to her husband around 24 when noted that he she was having significant confusion.    Past Medical History:  Diagnosis Date  . Dementia (HCC)    No family history on file. No past surgical history on file. There are no problems to display for this patient.     Prior to Admission medications   Medication Sig Start Date End Date Taking? Authorizing Provider  traMADol (ULTRAM) 50 MG tablet Take 2 tablets (100 mg total) by mouth every 8 (eight) hours as needed for moderate pain or severe pain. 02/10/18   Loleta Rose, MD    Allergies Patient has no known allergies.    Social History Social History   Tobacco Use  . Smoking status: Never Smoker  . Smokeless tobacco: Never Used  Substance Use Topics  . Alcohol use: Not on file  . Drug use: Not on file    Review of Systems Patient denies headaches, rhinorrhea, blurry vision, numbness, shortness of breath, chest pain, edema, cough, abdominal pain, nausea, vomiting, diarrhea, dysuria, fevers, rashes or hallucinations unless otherwise stated above in  HPI. ____________________________________________   PHYSICAL EXAM:  VITAL SIGNS: Vitals:   11/16/19 1435  BP: 121/80  Pulse: (!) 114  Resp: 18  Temp: 97.8 F (36.6 C)  SpO2: 97%    Constitutional: Alert, in NAD Eyes: Conjunctivae are normal.  Head: Atraumatic. Nose: No congestion/rhinnorhea. Mouth/Throat: Mucous membranes are moist.   Neck: No stridor. Painless ROM.  Cardiovascular: Normal rate, regular rhythm. Grossly normal heart sounds.  Good peripheral circulation. Respiratory: Normal respiratory effort.  No retractions. Lungs CTAB. Gastrointestinal: Soft and nontender. No distention. No abdominal bruits. No CVA tenderness. Genitourinary:  Musculoskeletal: No lower extremity tenderness nor edema.  No joint effusions. Neurologic moving all extremities spontaneously.  No lateralizing weakness.  No facial droop appreciated.  Cranial nerves are intact.  Identifies all 3 images correctly.  She is noting some confusion is slow to answer questions. Skin:  Skin is warm, dry and intact. No rash noted. Psychiatric: Mood and affect are normal. ____________________________________________   LABS (all labs ordered are listed, but only abnormal results are displayed)  Results for orders placed or performed during the hospital encounter of 11/16/19 (from the past 24 hour(s))  CBG monitoring, ED     Status: Abnormal   Collection Time: 11/16/19  2:40 PM  Result Value Ref Range   Glucose-Capillary 182 (H) 70 - 99 mg/dL  I-stat Creatinine, ED     Status: None   Collection Time: 11/16/19  3:06 PM  Result Value Ref Range   Creatinine, Ser 0.70 0.44 - 1.00 mg/dL  Protime-INR  Status: None   Collection Time: 11/16/19  3:45 PM  Result Value Ref Range   Prothrombin Time 12.2 11.4 - 15.2 seconds   INR 0.9 0.8 - 1.2  APTT     Status: None   Collection Time: 11/16/19  3:45 PM  Result Value Ref Range   aPTT 30 24 - 36 seconds  CBC     Status: Abnormal   Collection Time: 11/16/19   3:45 PM  Result Value Ref Range   WBC 10.8 (H) 4.0 - 10.5 K/uL   RBC 5.33 (H) 3.87 - 5.11 MIL/uL   Hemoglobin 14.7 12.0 - 15.0 g/dL   HCT 66.4 36 - 46 %   MCV 84.6 80.0 - 100.0 fL   MCH 27.6 26.0 - 34.0 pg   MCHC 32.6 30.0 - 36.0 g/dL   RDW 40.3 47.4 - 25.9 %   Platelets 285 150 - 400 K/uL   nRBC 0.0 0.0 - 0.2 %  Differential     Status: Abnormal   Collection Time: 11/16/19  3:45 PM  Result Value Ref Range   Neutrophils Relative % 88 %   Neutro Abs 9.4 (H) 1.7 - 7.7 K/uL   Lymphocytes Relative 8 %   Lymphs Abs 0.9 0.7 - 4.0 K/uL   Monocytes Relative 4 %   Monocytes Absolute 0.4 0.1 - 1.0 K/uL   Eosinophils Relative 0 %   Eosinophils Absolute 0.0 0.0 - 0.5 K/uL   Basophils Relative 0 %   Basophils Absolute 0.0 0.0 - 0.1 K/uL   Immature Granulocytes 0 %   Abs Immature Granulocytes 0.04 0.00 - 0.07 K/uL  Comprehensive metabolic panel     Status: Abnormal   Collection Time: 11/16/19  3:45 PM  Result Value Ref Range   Sodium 133 (L) 135 - 145 mmol/L   Potassium 4.4 3.5 - 5.1 mmol/L   Chloride 99 98 - 111 mmol/L   CO2 23 22 - 32 mmol/L   Glucose, Bld 156 (H) 70 - 99 mg/dL   BUN 14 8 - 23 mg/dL   Creatinine, Ser 5.63 0.44 - 1.00 mg/dL   Calcium 9.0 8.9 - 87.5 mg/dL   Total Protein 7.7 6.5 - 8.1 g/dL   Albumin 3.8 3.5 - 5.0 g/dL   AST 29 15 - 41 U/L   ALT 20 0 - 44 U/L   Alkaline Phosphatase 74 38 - 126 U/L   Total Bilirubin 1.0 0.3 - 1.2 mg/dL   GFR, Estimated >64 >33 mL/min   Anion gap 11 5 - 15  Respiratory Panel by RT PCR (Flu A&B, Covid) - Nasopharyngeal Swab     Status: None   Collection Time: 11/16/19  3:45 PM   Specimen: Nasopharyngeal Swab  Result Value Ref Range   SARS Coronavirus 2 by RT PCR NEGATIVE NEGATIVE   Influenza A by PCR NEGATIVE NEGATIVE   Influenza B by PCR NEGATIVE NEGATIVE   ____________________________________________  EKG My review and personal interpretation at Time: 15:26   Indication: confusion  Rate: 100  Rhythm: sinus Axis:  normal Other: normal intervals, no stemi ____________________________________________  RADIOLOGY  I personally reviewed all radiographic images ordered to evaluate for the above acute complaints and reviewed radiology reports and findings.  These findings were personally discussed with the patient.  Please see medical record for radiology report.  ____________________________________________   PROCEDURES  Procedure(s) performed:  Procedures    Critical Care performed: no ____________________________________________   INITIAL IMPRESSION / ASSESSMENT AND PLAN / ED COURSE  Pertinent  labs & imaging results that were available during my care of the patient were reviewed by me and considered in my medical decision making (see chart for details).   DDX: cva, tia, hypoglycemia, dehydration, electrolyte abnormality, dissection, sepsis   Keani Gotcher is a 79 y.o. who presents to the ED with presentation as described above.  She had symptoms starting to worsen at 11:00 she was made code stroke.  She was evaluated immediately by neurology at bedside.  It was determined that we would defer TPA at this time based on low NIH score.  There is also may be a bit of uncertainty as to when her symptoms truly started as she feels like she woke up with the symptoms but family states that they started around 67.  CT head without evidence of stroke.  CTA without evidence of LVO.  Will send blood work for evaluation of metabolic causes of this confusion.  Will discuss with hospitalist for admission.     The patient was evaluated in Emergency Department today for the symptoms described in the history of present illness. He/she was evaluated in the context of the global COVID-19 pandemic, which necessitated consideration that the patient might be at risk for infection with the SARS-CoV-2 virus that causes COVID-19. Institutional protocols and algorithms that pertain to the evaluation of patients at risk for  COVID-19 are in a state of rapid change based on information released by regulatory bodies including the CDC and federal and state organizations. These policies and algorithms were followed during the patient's care in the ED.  As part of my medical decision making, I reviewed the following data within the electronic MEDICAL RECORD NUMBER Nursing notes reviewed and incorporated, Labs reviewed, notes from prior ED visits and [place] Controlled Substance Database   ____________________________________________   FINAL CLINICAL IMPRESSION(S) / ED DIAGNOSES  Final diagnoses:  Dizziness      NEW MEDICATIONS STARTED DURING THIS VISIT:  New Prescriptions   No medications on file     Note:  This document was prepared using Dragon voice recognition software and may include unintentional dictation errors.    Willy Eddy, MD 11/16/19 [phone]

## 2019-11-16 NOTE — ED Provider Notes (Signed)
Head CT results discussed with Dr. Amada Jupiter (neuro). Dr. Roxan Hockey seeing as primary ER physician.    Sharyn Creamer, MD 11/16/19 1513

## 2019-11-16 NOTE — ED Notes (Signed)
Son given phone for MRI screen

## 2019-11-16 NOTE — Progress Notes (Signed)
CH responded to CODE STROKE in ED; pt. in CT scan when [facility] arrived at rm. but pt.'s son in rm.  Son says pt. and his father live w/him; son shares no immediate needs at this time; CH is available as needed.

## 2019-11-16 NOTE — Consult Note (Addendum)
Neurology Consultation Reason for Consult: Difficulty speaking Referring Physician: Lyndon Code  CC: Difficulty speaking  History is obtained from: Patient, son  HPI: Rachel Yang is a 79 y.o. female who states that she woke up feeling a little off already this morning, and that she was dizzy.  She was still speaking normally, however, and did not complain to her husband until around 43 AM.  Prior to this she had been speaking relatively normally, but during a FaceTime conversation she had abrupt cessation of speech.  She subsequently was complaining of dizziness and it was at that time  Given concern for aphasia code stroke was activated and she was taken for an emergent head CT/CTA.  LKW: 11/09 prior to bed tpa given?: no, outside of window    ROS: A 14 point ROS was performed and is negative except as noted in the HPI.   Past Medical History:  Diagnosis Date  . Dementia (HCC)      Family history: Limited due to dementia/confusion   Social History:  reports that she has never smoked. She has never used smokeless tobacco. No history on file for alcohol use and drug use.   Exam: Current vital signs: BP 121/80 (BP Location: Right Arm)   Pulse (!) 114   Temp 97.8 F (36.6 C) (Oral)   Resp 18   SpO2 97%  Vital signs in last 24 hours: Temp:  [97.8 F (36.6 C)] 97.8 F (36.6 C) (11/10 1435) Pulse Rate:  [114] 114 (11/10 1435) Resp:  [18] 18 (11/10 1435) BP: (121)/(80) 121/80 (11/10 1435) SpO2:  [97 %] 97 % (11/10 1435)   Physical Exam  Constitutional: Appears well-developed and well-nourished.  Psych: Affect appropriate to situation Eyes: No scleral injection HENT: No OP obstrucion MSK: no joint deformities.  Cardiovascular: Normal rate and regular rhythm.  Respiratory: Effort normal, non-labored breathing GI: Soft.  No distension. There is no tenderness.  Skin: WDI  Neuro: Mental Status: Patient is awake, alert, she gives the month as September and is  unable to give me the year Patient is able to give a clear and coherent history. She is able to name simple objects, and able to repeat, but she has clear increased latency of response Cranial Nerves: II: Visual Fields are full. Pupils are equal, round, and reactive to light.   III,IV, VI: EOMI without ptosis or diploplia.  V: Facial sensation is symmetric to temperature VII: Facial movement is symmetric.  VIII: hearing is intact to voice X: Uvula elevates symmetrically XI: Shoulder shrug is symmetric. XII: tongue is midline without atrophy or fasciculations.  Motor: Tone is normal. Bulk is normal. 5/5 strength was present in all four extremities.  Sensory: Sensation is difficult to assess due to inconsistent responses, but after much difficulty she endorses equal sensation Cerebellar: No clear ataxia on finger-nose-finger bilaterally     I have reviewed labs in epic and the results pertinent to this consultation are: CBG 182 Creatinine 0.7  I have reviewed the images obtained: CT head-negative CTA-multifocal intracranial atherosclerosis  Impression: 79 year old female with mental status change and dizziness.  She does not have anything that is definitively localizing on exam, but I am concerned that she may have had an ischemic event given the degree of intracranial atherosclerosis. I would favor treating this as stroke/TIA, though if imaging is negative, would also get an EEG.   Also possible would be toxic/metabolic causes given her history of dementia.  Recommendations: - HgbA1c, fasting lipid panel - MRI  of the brain without contrast - Frequent neuro checks - Echocardiogram - Prophylactic therapy-Antiplatelet med: Aspirin - dose 325mg  PO or 300mg  PR - Risk factor modification - Telemetry monitoring - EEG if MRI is negative.  - ammonia, tsh, UA  , MD [facility] [phone]  If 7pm- 7am, please page neurology on call as listed in  [facility].

## 2019-11-17 ENCOUNTER — Other Ambulatory Visit: Payer: Self-pay

## 2019-11-17 ENCOUNTER — Encounter: Payer: Self-pay | Admitting: Student

## 2019-11-17 DIAGNOSIS — E785 Hyperlipidemia, unspecified: Secondary | ICD-10-CM | POA: Diagnosis not present

## 2019-11-17 DIAGNOSIS — R42 Dizziness and giddiness: Secondary | ICD-10-CM | POA: Diagnosis not present

## 2019-11-17 DIAGNOSIS — R2990 Unspecified symptoms and signs involving the nervous system: Secondary | ICD-10-CM

## 2019-11-17 DIAGNOSIS — G459 Transient cerebral ischemic attack, unspecified: Secondary | ICD-10-CM

## 2019-11-17 DIAGNOSIS — E1169 Type 2 diabetes mellitus with other specified complication: Secondary | ICD-10-CM | POA: Diagnosis not present

## 2019-11-17 LAB — CBC
HCT: 42.9 % (ref 36.0–46.0)
Hemoglobin: 14.2 g/dL (ref 12.0–15.0)
MCH: 28 pg (ref 26.0–34.0)
MCHC: 33.1 g/dL (ref 30.0–36.0)
MCV: 84.4 fL (ref 80.0–100.0)
Platelets: 265 10*3/uL (ref 150–400)
RBC: 5.08 MIL/uL (ref 3.87–5.11)
RDW: 13.9 % (ref 11.5–15.5)
WBC: 8.8 10*3/uL (ref 4.0–10.5)
nRBC: 0 % (ref 0.0–0.2)

## 2019-11-17 LAB — GLUCOSE, CAPILLARY
Glucose-Capillary: 151 mg/dL — ABNORMAL HIGH (ref 70–99)
Glucose-Capillary: 137 mg/dL — ABNORMAL HIGH (ref 70–99)
Glucose-Capillary: 112 mg/dL — ABNORMAL HIGH (ref 70–99)
Glucose-Capillary: 121 mg/dL — ABNORMAL HIGH (ref 70–99)

## 2019-11-17 LAB — LIPID PANEL
Cholesterol: 314 mg/dL — ABNORMAL HIGH (ref 0–200)
HDL: 48 mg/dL (ref >40)
LDL Cholesterol: 234 mg/dL — ABNORMAL HIGH (ref 0–99)
Total CHOL/HDL Ratio: 6.5 RATIO
Triglycerides: 158 mg/dL — ABNORMAL HIGH (ref <150)
VLDL: 32 mg/dL (ref 0–40)

## 2019-11-17 LAB — BASIC METABOLIC PANEL
Anion gap: 8 (ref 5–15)
BUN: 13 mg/dL (ref 8–23)
CO2: 23 mmol/L (ref 22–32)
Calcium: 9 mg/dL (ref 8.9–10.3)
Chloride: 104 mmol/L (ref 98–111)
Creatinine, Ser: 0.72 mg/dL (ref 0.44–1.00)
GFR, Estimated: >60 mL/min (ref >60)
Glucose, Bld: 150 mg/dL — ABNORMAL HIGH (ref 70–99)
Potassium: 3.8 mmol/L (ref 3.5–5.1)
Sodium: 135 mmol/L (ref 135–145)

## 2019-11-17 LAB — PHOSPHORUS: Phosphorus: 3.2 mg/dL (ref 2.5–4.6)

## 2019-11-17 LAB — MAGNESIUM: Magnesium: 2 mg/dL (ref 1.7–2.4)

## 2019-11-17 MED ORDER — ATORVASTATIN CALCIUM 20 MG PO TABS
80.0000 mg | ORAL_TABLET | Freq: Every evening | ORAL | Status: DC
  Administered 2019-11-17: 80 mg via ORAL
  Filled 2019-11-17: qty 4

## 2019-11-17 MED ORDER — ASPIRIN 300 MG RE SUPP: 300.0000 mg | Freq: Every day | RECTAL | Status: DC

## 2019-11-17 MED ORDER — SODIUM CHLORIDE 0.9 % IV SOLN: INTRAVENOUS | Status: DC

## 2019-11-17 MED ORDER — CLOPIDOGREL BISULFATE 75 MG PO TABS: 75.0000 mg | ORAL_TABLET | Freq: Every day | ORAL | 0 refills | Status: AC

## 2019-11-17 MED ORDER — ATORVASTATIN CALCIUM 80 MG PO TABS: 80.0000 mg | ORAL_TABLET | Freq: Every evening | ORAL | 0 refills | Status: AC

## 2019-11-17 MED ORDER — STROKE: EARLY STAGES OF RECOVERY BOOK: Freq: Once | Status: AC

## 2019-11-17 MED ORDER — METFORMIN HCL 500 MG PO TABS: 500.0000 mg | ORAL_TABLET | Freq: Two times a day (BID) | ORAL | 0 refills | Status: AC

## 2019-11-17 MED ORDER — INSULIN ASPART 100 UNIT/ML ~~LOC~~ SOLN
0.0000 [IU] | Freq: Three times a day (TID) | SUBCUTANEOUS | Status: DC
  Administered 2019-11-17: 2 [IU] via SUBCUTANEOUS
  Administered 2019-11-17 – 2019-11-18 (×2): 1 [IU] via SUBCUTANEOUS
  Filled 2019-11-17 (×3): qty 1

## 2019-11-17 MED ORDER — ASPIRIN 81 MG PO CHEW
81.0000 mg | CHEWABLE_TABLET | Freq: Every day | ORAL | Status: DC
  Administered 2019-11-17 – 2019-11-18 (×2): 81 mg via ORAL
  Filled 2019-11-17 (×2): qty 1

## 2019-11-17 MED ORDER — ASPIRIN 325 MG PO TABS
325.0000 mg | ORAL_TABLET | Freq: Every day | ORAL | Status: DC
  Administered 2019-11-17: 09:00:00 325 mg via ORAL
  Filled 2019-11-17: qty 1

## 2019-11-17 MED ORDER — ACETAMINOPHEN 650 MG RE SUPP: 650.0000 mg | RECTAL | Status: DC | PRN

## 2019-11-17 MED ORDER — CLOPIDOGREL BISULFATE 75 MG PO TABS
75.0000 mg | ORAL_TABLET | Freq: Every day | ORAL | Status: DC
  Administered 2019-11-17 – 2019-11-18 (×2): 75 mg via ORAL
  Filled 2019-11-17 (×2): qty 1

## 2019-11-17 MED ORDER — INSULIN ASPART 100 UNIT/ML ~~LOC~~ SOLN: 0.0000 [IU] | Freq: Every day | SUBCUTANEOUS | Status: DC

## 2019-11-17 MED ORDER — ENOXAPARIN SODIUM 40 MG/0.4ML ~~LOC~~ SOLN
40.0000 mg | SUBCUTANEOUS | Status: DC
  Administered 2019-11-17 – 2019-11-18 (×2): 40 mg via SUBCUTANEOUS
  Filled 2019-11-17 (×2): qty 0.4

## 2019-11-17 MED ORDER — ACETAMINOPHEN 160 MG/5ML PO SOLN
650.0000 mg | ORAL | Status: DC | PRN
  Filled 2019-11-17: qty 20.3

## 2019-11-17 MED ORDER — ACETAMINOPHEN 325 MG PO TABS: 650.0000 mg | ORAL_TABLET | ORAL | Status: DC | PRN

## 2019-11-17 MED ORDER — SENNOSIDES-DOCUSATE SODIUM 8.6-50 MG PO TABS: 1.0000 | ORAL_TABLET | Freq: Every evening | ORAL | Status: DC | PRN

## 2019-11-17 NOTE — Procedures (Addendum)
Patient Name: Rachel Yang  MRN: [id number]  Epilepsy Attending: Charlsie Quest  Referring Physician/Provider: Dr Andreas Newport Date: 11/17/2019  Duration: 28.14 mins  Patient history: 79 year old female with altered mental status and dizziness. EEG to evaluate for seizures.  Level of alertness: Awake, asleep  AEDs during EEG study: None  Technical aspects: This EEG study was done with scalp electrodes positioned according to the 10-20 International system of electrode placement. Electrical activity was acquired at a sampling rate of 500Hz  and reviewed with a high frequency filter of 70Hz  and a low frequency filter of 1Hz . EEG data were recorded continuously and digitally stored.   Description: The posterior dominant rhythm consists of 9-10 Hz activity of moderate voltage (25-35 uV) seen predominantly in posterior head regions, symmetric and reactive to eye opening and eye closing. Sleep was characterized by vertex waves, sleep spindles (12 to 14 Hz), maximal frontocentral region.  EEG showed intermittent generalized 3 to 6 Hz theta-delta slowing.  Single sharp transient was seen in left temporal region.  Physiologic photic driving was not seen during photic stimulation.  Hyperventilation was not performed.     ABNORMALITY -Intermittent slow, generalized  IMPRESSION: This study is suggestive of mild diffuse encephalopathy, nonspecific etiology. No seizures or epileptiform discharges were seen throughout the recording.  Raquelle Pietro 

## 2019-11-17 NOTE — Evaluation (Addendum)
Speech Language Pathology Evaluation Patient Details Name: Rachel Yang MRN: [id number] DOB: 01-14-1940 Today's Date: 11/17/2019 Time: 1330-1430 SLP Time Calculation (min) (ACUTE ONLY): 60 min  Problem List:  Patient Active Problem List   Diagnosis Date Noted  . Stroke-like symptom 11/16/2019   Past Medical History:  Past Medical History:  Diagnosis Date  . Dementia [facility])    Past Surgical History: History reviewed. No pertinent surgical history. HPI:  Pt is a 79 y.o. 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  Patient speaks Spanish, patient's son at bedside who helped with translation.  ED physician chart reviewed, patient woke up in the morning time she was not feeling fine she felt something wrong and she was feeling dizzy and later on she found to have some aphasia as well talking to her husband.  Patient was feeling little bit of lightheadedness and dizziness but all the symptoms has been resolved.    MRI (11/10): 1. No acute intracranial abnormality.; Moderately advanced chronic small vessel disease, most pronounced in the left deep white matter capsules with associated Wallerian degeneration.   Assessment / Plan / Recommendation Clinical Impression  Pt was seen bedside for cognitive-linguistic screening. Screening tool used was Western Aphasia Battery Bedside Record Form. Pt native language is Spanish-- translator present. Based on informal screening, pt is showing s/s of Cognitive-communication deficit. Upon clinician entrance to room, pt was asleep in bed. Pt awoke easily, and was positioned in an upright position in bed for easier engagement w/ clinician. Pt spoke some English (min) and intermittently responded to questions in [place], although  primary language used was [place].   During an informal conversation, the pt frequently demonstrated s/s of confusion-- shrugging at orientation questions, staring into space, increased processing time and giggling. Pt demonstrated relative strength w/ verbally declaring her living situation (told clinician she lives w/ her husband, son, and grandson), place of origin, birthday, and name. However, she was unable to accurately provide: current month/year, how many years she has lived in the [place] reason for admit, and current city she resides in. She was able to provide her grandsons age, but when asked further about her family, she shrugged. Pt appeared to shrug frequently when she was unsure of an answer. During informal conversation, simple and specific questions appeared to increase pt accuracy of response.   Given a picture description task, the pt was able to provide non-specific, general observations about the photo. However, when prompted verbally to provide more specific/detailed information, pt repeated the same general observation. Per translator, pt was shown the same stimuli image yesterday, and had a similar response. Pt Auditory Verbal Comprehension (Y/N Questions) appears to be a relative strength, w/ pt 100% accurate when prompted w/ simple Y/N questions. However, as questions increased in cognitive-linguistic demand, pt's accuracy decreased. Overall performance on Auditory Verbal Comprehension was 8/10-- both questions missed were increased in complexity. Pt was able to follow 3/4 sequential command tasks accurately. She was unable to accurately follow sequential commands when they increased from two steps to four steps-- during the four step command, she only followed the first two steps. Pt appeared to have delayed processing time given increased complexity of command. Repetition task was limited d/t pt relying on translator-- branched down to shorter phrases for repetition task  d/t inability  to accurately translate 2/6 phrases from [place] to [place]. Repetition task used direct involvement of translator (translator reading test items directly w/o clinician stating them in English first) to reduce complexity of task. W/ this accomodation, pt was able to produce 4/4 tested items on the Repetition task. Object Naming appears to be a relative strength for this pt, w/ accurate naming of 10/10 stimulus items given visual/tactile cues. Pt was also able to name the function of 10/10 stimulus items accurately. Of note, pt initially referred to remote as telephone, which could possibly be attributed to pt age/unfamiliarity w/ item. When prompted to provided a similarity and a difference b/w 2 stimuli items (pen & pencil), pt was able to accurately and independently provide them w/ min increased time for processing.  Pt appears to have cognitive-communication deficit, consistent w/ "Moderately advanced chronic small vessel disease" & "Wallerian degeneration" per chart. Pt exhibits relative strength in the areas of: Object Naming, Object function, Yes/No Questions, and following simple 2-step commands. Recommend f/u w/ outpatient or homehealth ST services for ADLs & pt safety. Recommend f/u evaluation w/ neurology for evaluation of cognitive abilities baseline. F/u w/ ST services for any further formal assessment & treatment as this assessment was limited d/t acute care setting. NSG/MD updated. ST services to sign off at this time.    SLP Assessment  SLP Recommendation/Assessment: All further Speech Lanaguage Pathology  needs can be addressed in the next venue of care SLP Visit Diagnosis: Cognitive communication deficit (R41.841)    Follow Up Recommendations  Home health SLP;Outpatient SLP    Frequency and Duration Other (Comment) (next venue of care)         SLP Evaluation Cognition  Overall Cognitive Status: No family/caregiver present to determine baseline cognitive  functioning Arousal/Alertness: Awake/alert Orientation Level: Oriented to person (birthday) Awareness: Impaired Problem Solving: Appears intact Behaviors:  (giggling)       Comprehension  Auditory Comprehension Overall Auditory Comprehension: Appears within functional limits for tasks assessed Yes/No Questions: Impaired Commands: Within Functional Limits Conversation: Simple Other Conversation Comments: distractable Interfering Components: Attention EffectiveTechniques: Extra processing time;Increased volume;Pausing;Repetition;Visual/Gestural cues Visual Recognition/Discrimination Discrimination: Not tested Reading Comprehension Reading Status: Not tested    Expression Expression Primary Mode of Expression: Verbal Verbal Expression Overall Verbal Expression: Impaired Automatic Speech: Name;Social Response Level of Generative/Spontaneous Verbalization: Word;Phrase;Sentence Repetition: No impairment Naming: No impairment Pragmatics: Unable to assess Interfering Components: Attention Effective Techniques: Semantic cues Written Expression Dominant Hand: Right   Oral / Motor  Oral Motor/Sensory Function Overall Oral Motor/Sensory Function: Within functional limits Motor Speech Overall Motor Speech: Appears within functional limits for tasks assessed Respiration: Within functional limits Phonation: Normal Resonance: Within functional limits Articulation: Within functional limitis Intelligibility: Intelligible Motor Planning: Witnin functional limits Motor Speech Errors: Not applicable   GO                    Oliver Pila  Graduate Clinician 11/17/2019, 3:17 PM  The information in this patient note, response to treatment, and overall treatment plan developed has been reviewed and agreed upon after reviewing documentation. This session was performed under the supervision of this licensed clinician.   Jerilynn Som, MS, CCC-SLP Speech Language Pathologist Rehab  Services [phone]

## 2019-11-17 NOTE — Evaluation (Signed)
Occupational Therapy Evaluation Patient Details Name: Rachel Yang MRN: [id number] DOB: March 18, 1940 Today's Date: 11/17/2019    History of Present Illness 79 y.o. 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  Patient speaks Spanish, patient's son at bedside who helped with translation.  ED physician chart reviewed, patient woke up in the morning time she was not feeling fine she felt something wrong and she was feeling dizzy and later on she found to have some aphasia as well talking to her husband.  Patient was feeling little bit of lightheadedness and dizziness but all the symptoms has been resolved.  Patient denied any focal deficit, no sensory deficit, no vision changes.  Denied any palpitations, no chest pain, no shortness of breath. 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   Clinical Impression   Patient presenting with decreased I in self care, balance, functional mobility/transfer, endurance, safety awareness, and strength. Use of stratus video interpreter to obtain/communicate with pt this session. No family present to confirm information.  Patient reports being mod I  PTA with use of rollator. She lives with husband and son and family assists with IADLs as needed. She is independent with ADLs. Pt noted to have weakness and coordination difficulties in both UE but L worse than right. L inattention noted with mobility and self care tasks.Pt needing Min +2 assistance this session for management of balance and leads/lines safely. Pt needing min - mod multimodal cuing for initiation and sequencing with increased time for slow processing.Pt does report having 24/7 assist. Patient will benefit from acute OT to increase overall independence in the areas of  ADLs, functional mobility, and safety awareness in order to safely discharge home with family.    Follow Up Recommendations  Home health OT;Supervision/Assistance - 24 hour    Equipment Recommendations  None recommended by OT    Recommendations for Other Services Other (comment) (none at this time)     Precautions / Restrictions Precautions Precautions: Fall      Mobility Bed Mobility        General bed mobility comments: Pt seated on EOB with PT when therapist enters the room    Transfers Overall transfer level: Needs assistance Equipment used: 4-wheeled walker Transfers: Sit to/from Stand;Stand Pivot Transfers Sit to Stand: +2 safety/equipment Stand pivot transfers: +2 safety/equipment       General transfer comment: Min A for balance and second helper to manage lines/leads for safety    Balance Overall balance assessment: Needs assistance Sitting-balance support: Feet supported Sitting balance-Leahy Scale: Good Sitting balance - Comments: no LOB   Standing balance support: During functional activity;Bilateral upper extremity supported Standing balance-Leahy Scale: Fair Standing balance comment: reliance on rollator for UE support        ADL either performed or assessed with clinical judgement   ADL Overall ADL's : Needs assistance/impaired Eating/Feeding: Supervision/ safety;Set up;Cueing for safety   Grooming: Wash/dry hands;Wash/dry face;Oral care;Supervision/safety;Set up;Sitting      Toilet Transfer: Minimal assistance;BSC;Ambulation;RW;+2 for safety/equipment   Toileting- Clothing Manipulation and Hygiene: Minimal assistance;Sit to/from stand       Functional mobility during ADLs: Minimal assistance;+2 for safety/equipment;Cueing for safety;Cueing for sequencing General ADL Comments: Pt needing min - mod multimodal cuing for sequencing, initiation, and safety with functional tasks  Vision Baseline Vision/History: Wears glasses Wears  Glasses: At all times Patient Visual Report: No change from baseline Additional Comments: Pt unable to follow formal assessment secondary to cognition/language barrier. Pt reports no diplopia or vision distrubances.            Pertinent Vitals/Pain Pain Assessment: No/denies pain     Hand Dominance Right   Extremity/Trunk Assessment Upper Extremity Assessment Upper Extremity Assessment: Generalized weakness;Difficult to assess due to impaired cognition;LUE deficits/detail;RUE deficits/detail RUE Deficits / Details: 3+/5 gross strength RUE Coordination: decreased fine motor LUE Deficits / Details: 3+/5 gross strength LUE Coordination: decreased fine motor;decreased gross motor   Lower Extremity Assessment Lower Extremity Assessment: Defer to PT evaluation       Communication Communication Communication: Prefers language other than [place];Other (comment) (spanish speaking)   Cognition Arousal/Alertness: Awake/alert Behavior During Therapy: Flat affect Overall Cognitive Status: Difficult to assess    General Comments: Difficult to assess secondary to language and communication deficits. Pt appears to be oriented to self and location. Pt has slow processing for motor tasks and when answering questions. Pt needing min cuing for redirection of task and also displays some inattention to the L during session with self care tasks.              Home Living Family/patient expects to be discharged to:: Private residence Living Arrangements: Spouse/significant other;Children Available Help at Discharge: Available 24 hours/day;Family Type of Home: House Home Access: Stairs to enter [employer] of Steps: 3 Entrance Stairs-Rails: Right;Left Home Layout: Two level;Able to live on main level with bedroom/bathroom     Bathroom Shower/Tub: Producer, television/film/video: Standard     Home Equipment: Walker - 4 wheels;Grab bars - toilet;Grab bars - tub/shower;Shower  seat          Prior Functioning/Environment Level of Independence: Independent with assistive device(s)        Comments: I with ADLs and mobility with use of rollator. Family assists with IADL tasks as needed.        OT Problem List: Decreased strength;Decreased coordination;Decreased cognition;Decreased safety awareness;Decreased activity tolerance;Impaired balance (sitting and/or standing);Decreased knowledge of use of DME or AE;Decreased knowledge of precautions;Impaired vision/perception;Impaired UE functional use      OT Treatment/Interventions: Self-care/ADL training;Therapeutic exercise;Therapeutic activities;Neuromuscular education;Cognitive remediation/compensation;Energy conservation;Visual/perceptual remediation/compensation;DME and/or AE instruction;Patient/family education;Balance training;Manual therapy    OT Goals(Current goals can be found in the care plan section) Acute Rehab OT Goals Patient Stated Goal: to go home OT Goal Formulation: With patient Time For Goal Achievement: 12/01/19 Potential to Achieve Goals: Good ADL Goals Pt Will Perform Grooming: with supervision;standing Pt Will Perform Lower Body Dressing: with supervision;sit to/from stand Pt Will Transfer to Toilet: with supervision;ambulating Pt Will Perform Toileting - Clothing Manipulation and hygiene: with supervision;sit to/from stand  OT Frequency: Min 2X/week   Barriers to D/C: Other (comment)  none known at this time       Co-evaluation PT/OT/SLP Co-Evaluation/Treatment: Yes Reason for Co-Treatment: For patient/therapist safety;To address functional/ADL transfers;Necessary to address cognition/behavior during functional activity PT goals addressed during session: Mobility/safety with mobility;Balance OT goals addressed during session: ADL's and self-care;Proper use of Adaptive equipment and DME      AM-PAC OT "6 Clicks" Daily Activity     Outcome Measure Help from another person eating  meals?: A Little Help from another person taking care of personal grooming?: A Little Help from another person toileting, which includes using toliet, bedpan, or urinal?: A Little Help from another person bathing (including washing,  rinsing, drying)?: A Little Help from another person to put on and taking off regular upper body clothing?: A Little Help from another person to put on and taking off regular lower body clothing?: A Little 6 Click Score: 18   End of Session Equipment Utilized During Treatment: Rolling walker Nurse Communication: Mobility status  Activity Tolerance: Patient tolerated treatment well Patient left: in chair;with chair alarm set;with call bell/phone within reach  OT Visit Diagnosis: Unsteadiness on feet (R26.81);Muscle weakness (generalized) (M62.81);Cognitive communication deficit (R41.841);Hemiplegia and hemiparesis                Time: 0920-0946 OT Time Calculation (min): 26 min Charges:  OT General Charges $OT Visit: 1 Visit OT Evaluation $OT Eval Low Complexity: 1 Low OT Treatments $Self Care/Home Management : 8-22 mins  Jackquline Denmark, MS, OTR/L , CBIS ascom [phone]  11/17/19, 11:19 AM

## 2019-11-17 NOTE — Discharge Summary (Addendum)
Discharge Summary  Rachel Yang ZOX:[id number] DOB: 12/27/1940  PCP: Patient, No Pcp Per  Admit date: 11/16/2019 Discharge date: 11/18/2019  Time spent: 30 mins  Recommendations for Outpatient Follow-up:  1. Follow-up with PCP via [facility] 2. Follow up with neurology   Discharge Diagnoses:  Active Hospital Problems   Diagnosis Date Noted  . Stroke-like symptom 11/16/2019    Resolved Hospital Problems  No resolved problems to display.    Discharge Condition: Stable   Diet recommendation: Heart healthy/moderate carb  Vitals:   11/18/19 0453 11/18/19 0755  BP: 130/81 128/66  Pulse: 94 76  Resp: 20 16  Temp: 97.7 F (36.5 C) 97.7 F (36.5 C)  SpO2: 99% 100%    History of present illness:  Rachel Yang is a 79 y.o. 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 In the ED, pt symptoms had improved. Patient denied any focal deficit, no sensory deficit, no vision changes.  Denied any palpitations, no chest pain, no shortness of breath. In the ED, noted mild hyponatremia sodium 133, mildly elevated WBC count 10.8 could be reactive leukocytosis mild hyperglycemia blood glucose 156, TSH 3.6 with normal range, Covid and flu negative.  CT head negative, CTA multifocal intracranial atherosclerosis.  MRI negative for acute ischemia. Patient admitted for further management     Today, patient denied any new complaints. Patient was able to answer simple questions appropriately.  No obvious focal neurologic deficits was noted.    Hospital Course:  Active Problems:   Stroke-like symptom   Likely TIA Prior history of CVA Noted to have difficulty in finding words, dizziness CTA head/neck showed severe atherosclerosis MRI brain negative for any acute infarct EEG suggestive of  mild diffuse encephalopathy, no seizures or epileptiform discharges were seen Echo showed EF of 60 to 65%, no regional wall motion abnormality, left ventricular diastolic parameters were indeterminate, no atrial level shunts detected by color-flow Doppler LDL 234, A1c 7.5 Neurology consulted, recommend dual antiplatelets aspirin plus Plavix for 3 weeks, and then Plavix alone PT/OT/SLP recommend home health, patient follows with [facility], and all of this will be arranged via [facility] PCP to arrange for neurology follow-up as an outpatient  Hyperlipidemia LDL 234 Start Lipitor 80 mg daily, PCP follow-up LFTs  Diabetes mellitus type 2 A1c 7.5 Started Metformin, and should be followed up closely by PCP  History of dementia Continue supportive care       Malnutrition Type:      Malnutrition Characteristics:      Nutrition Interventions:      Estimated body mass index is 26.37 kg/m as calculated from the following:   Height as of this encounter: 5\' 1"  (1.549 m).   Weight as of this encounter: 63.3 kg.    Procedures:  None  Consultations:  Neurology    Discharge Exam: BP 128/66 (BP Location: Left Arm)   Pulse 76   Temp 97.7 F (36.5 C)   Resp 16   Ht 5\' 1"  (1.549 m)   Wt 63.3 kg   SpO2 100%   BMI 26.37 kg/m   General: NAD Cardiovascular: S1, S2 present Respiratory: CTA B Neurology: No obvious focal neurologic deficits, strength equal in all extremities    Discharge Instructions You were cared for by a hospitalist during your hospital stay. If you  have any questions about your discharge medications or the care you received while you were in the hospital after you are discharged, you can call the unit and asked to speak with the hospitalist on call if the hospitalist that took care of you is not available. Once you are discharged, your primary care physician will handle any further medical issues. Please note that NO REFILLS for any discharge medications  will be authorized once you are discharged, as it is imperative that you return to your primary care physician (or establish a relationship with a primary care physician if you do not have one) for your aftercare needs so that they can reassess your need for medications and monitor your lab values.  Discharge Instructions    Diet - low sodium heart healthy   Complete by: As directed    Increase activity slowly   Complete by: As directed      Allergies as of 11/18/2019   No Known Allergies     Medication List    TAKE these medications   aspirin EC 81 MG tablet Take 81 mg by mouth daily. Swallow whole.   atorvastatin 80 MG tablet Commonly known as: LIPITOR Take 1 tablet (80 mg total) by mouth every evening.   Cholecalciferol 25 MCG (1000 UT) capsule Take 1,000 Units by mouth daily. Notes to patient: You have not received this while in the hospital   clopidogrel 75 MG tablet Commonly known as: PLAVIX Take 1 tablet (75 mg total) by mouth daily.   metFORMIN 500 MG tablet Commonly known as: Glucophage Take 1 tablet (500 mg total) by mouth 2 (two) times daily with a meal.      No Known Allergies    The results of significant diagnostics from this hospitalization (including imaging, microbiology, ancillary and laboratory) are listed below for reference.    Significant Diagnostic Studies: EEG  Result Date: 11/17/2019 Charlsie Quest, MD     11/17/2019  3:42 PM Patient Name: Rachel Yang MRN: [id number] Epilepsy Attending: Charlsie Quest Referring Physician/Provider: Dr Andreas Newport Date: 11/17/2019 Duration: 28.14 mins Patient history: 79 year old female with altered mental status and dizziness. EEG to evaluate for seizures. Level of alertness: Awake, asleep AEDs during EEG study: None Technical aspects: This EEG study was done with scalp electrodes positioned according to the 10-20 International system of electrode placement. Electrical activity was acquired at a  sampling rate of  and reviewed with a high frequency filter of  and a low frequency filter of . EEG data were recorded continuously and digitally stored. Description: The posterior dominant rhythm consists of 9-10 Hz activity of moderate voltage (25-35 uV) seen predominantly in posterior head regions, symmetric and reactive to eye opening and eye closing. Sleep was characterized by vertex waves, sleep spindles (12 to 14 Hz), maximal frontocentral region.  EEG showed intermittent generalized 3 to 6 Hz theta-delta slowing.  Single sharp transient was seen in left temporal region.  Physiologic photic driving was not seen during photic stimulation.  Hyperventilation was not performed.   ABNORMALITY -Intermittent slow, generalized IMPRESSION: This study is suggestive of mild diffuse encephalopathy, nonspecific etiology. No seizures or epileptiform discharges were seen throughout the recording. Charlsie Quest   CT Code Stroke CTA Head W/WO contrast  Addendum Date: 11/16/2019   ADDENDUM REPORT: 11/16/2019 15:43 ADDENDUM: Study discussed by telephone with Dr. Ritta Slot on 11/16/2019 at 1538 hours. Electronically Signed   By: Odessa Fleming M.D.   On: 11/16/2019 15:43   Result Date:  11/16/2019 CLINICAL DATA:  79 year old female code stroke presentation with dizziness and confusion. EXAM: CT ANGIOGRAPHY HEAD AND NECK TECHNIQUE: Multidetector CT imaging of the head and neck was performed using the standard protocol during bolus administration of intravenous contrast. Multiplanar CT image reconstructions and MIPs were obtained to evaluate the vascular anatomy. Carotid stenosis measurements (when applicable) are obtained utilizing NASCET criteria, using the distal internal carotid diameter as the denominator. CONTRAST:  4mL OMNIPAQUE IOHEXOL 350 MG/ML SOLN COMPARISON:  Plain head CT 1448 hours today. FINDINGS: CTA NECK Skeleton: No acute osseous abnormality identified. Lower cervical disc and endplate  degeneration. Mild degeneration in the spine for age overall. Upper chest: Mild atelectasis and/or scarring in the medial left upper lobe. No superior mediastinal lymphadenopathy. Other neck: No acute findings. Aortic arch: 3 vessel arch configuration. Tortuous distal arch with mild to moderate atherosclerosis. Right carotid system: Negative aside from tortuosity. No plaque or stenosis to the skull base. Left carotid system: Minimal plaque at the left CCA origin without stenosis. Tortuous left CCA and ICA similar to the right side. No stenosis. Vertebral arteries: Tortuous proximal right subclavian artery. Normal right vertebral artery origin. Tortuous right V1 segment. The right vertebral is non dominant. Tortuous right V2 and V3 segments but no plaque or stenosis to the skull base. Tortuous proximal right subclavian artery. Normal left vertebral artery origin on series 6, image 243 with tortuous left V1 segment. Dominant, tortuous left vertebral artery without plaque or stenosis to the skull base. CTA HEAD Posterior circulation: Dominant left vertebral artery is tortuous. Non dominant right vertebral artery is diminutive beyond the PICA but remains patent to the vertebrobasilar junction. Normal PICA origins. Patent basilar artery with tortuosity and mild irregularity but no stenosis (series 10 image 19). Patent SCA and right PCA origins. Fetal type left PCA origin with tortuous P1 segment. Moderate irregularity and stenosis of the left PCA P2 and P3 segments (series 11, images 32 and 33). Severe left P4 stenosis (image 32). Contralateral mild right P1 and P2 but similar moderate to severe distal right PCA branch irregularity and stenosis (series 11, image 26). Anterior circulation: Both ICA siphons are patent. The left is dominant and dolichoectatic. Only mild siphon calcified plaque occurs without significant stenosis. Normal ophthalmic and left posterior communicating artery origins. Patent left carotid terminus  with normal left MCA and ACA origins. The right A1 is diminutive or absent. Anterior communicating artery and bilateral ACA branches are patent with mild irregularity. Left MCA M1 segment is mildly irregular. Patent left MCA bifurcation with mild-to-moderate left M2 and M3 irregularity and stenosis. Contralateral right MCA M1 is severely stenotic over a segment of 6 mm just beyond its origin (series 7, image 43) and this more resembles severe atherosclerosis than thromboembolic disease (see series 9, image 123). Despite this the vessel remains patent to the bifurcation. At the bifurcation only mild stenosis occurs. Right MCA branches enhance and a relatively symmetric fashion. Venous sinuses: Early contrast timing, superior sagittal sinus is patent. Anatomic variants: Dominant left vertebral artery. Dominant left and diminutive or absent right ACA A1 segments. Fetal left PCA origin. Review of the MIP images confirms the above findings IMPRESSION: 1. No large vessel occlusion, but there is moderate to severe intracranial atherosclerosis with: - Severe Right MCA M1 stenosis over a segment of 6 mm (series 7, image 43). - Moderate Left PCA P2, and moderate to severe bilateral distal PCA branch stenoses. - up to moderate Left MCA M2 stenoses. 2. Generalized arterial tortuosity. Little  to no atherosclerosis in the neck. Electronically Signed: By: Odessa Fleming M.D. On: 11/16/2019 15:35   CT Code Stroke CTA Neck W/WO contrast  Addendum Date: 11/16/2019   ADDENDUM REPORT: 11/16/2019 15:43 ADDENDUM: Study discussed by telephone with Dr. Ritta Slot on 11/16/2019 at 1538 hours. Electronically Signed   By: Odessa Fleming M.D.   On: 11/16/2019 15:43   Result Date: 11/16/2019 CLINICAL DATA:  79 year old female code stroke presentation with dizziness and confusion. EXAM: CT ANGIOGRAPHY HEAD AND NECK TECHNIQUE: Multidetector CT imaging of the head and neck was performed using the standard protocol during bolus administration of  intravenous contrast. Multiplanar CT image reconstructions and MIPs were obtained to evaluate the vascular anatomy. Carotid stenosis measurements (when applicable) are obtained utilizing NASCET criteria, using the distal internal carotid diameter as the denominator. CONTRAST:  73mL OMNIPAQUE IOHEXOL 350 MG/ML SOLN COMPARISON:  Plain head CT 1448 hours today. FINDINGS: CTA NECK Skeleton: No acute osseous abnormality identified. Lower cervical disc and endplate degeneration. Mild degeneration in the spine for age overall. Upper chest: Mild atelectasis and/or scarring in the medial left upper lobe. No superior mediastinal lymphadenopathy. Other neck: No acute findings. Aortic arch: 3 vessel arch configuration. Tortuous distal arch with mild to moderate atherosclerosis. Right carotid system: Negative aside from tortuosity. No plaque or stenosis to the skull base. Left carotid system: Minimal plaque at the left CCA origin without stenosis. Tortuous left CCA and ICA similar to the right side. No stenosis. Vertebral arteries: Tortuous proximal right subclavian artery. Normal right vertebral artery origin. Tortuous right V1 segment. The right vertebral is non dominant. Tortuous right V2 and V3 segments but no plaque or stenosis to the skull base. Tortuous proximal right subclavian artery. Normal left vertebral artery origin on series 6, image 243 with tortuous left V1 segment. Dominant, tortuous left vertebral artery without plaque or stenosis to the skull base. CTA HEAD Posterior circulation: Dominant left vertebral artery is tortuous. Non dominant right vertebral artery is diminutive beyond the PICA but remains patent to the vertebrobasilar junction. Normal PICA origins. Patent basilar artery with tortuosity and mild irregularity but no stenosis (series 10 image 19). Patent SCA and right PCA origins. Fetal type left PCA origin with tortuous P1 segment. Moderate irregularity and stenosis of the left PCA P2 and P3 segments  (series 11, images 32 and 33). Severe left P4 stenosis (image 32). Contralateral mild right P1 and P2 but similar moderate to severe distal right PCA branch irregularity and stenosis (series 11, image 26). Anterior circulation: Both ICA siphons are patent. The left is dominant and dolichoectatic. Only mild siphon calcified plaque occurs without significant stenosis. Normal ophthalmic and left posterior communicating artery origins. Patent left carotid terminus with normal left MCA and ACA origins. The right A1 is diminutive or absent. Anterior communicating artery and bilateral ACA branches are patent with mild irregularity. Left MCA M1 segment is mildly irregular. Patent left MCA bifurcation with mild-to-moderate left M2 and M3 irregularity and stenosis. Contralateral right MCA M1 is severely stenotic over a segment of 6 mm just beyond its origin (series 7, image 43) and this more resembles severe atherosclerosis than thromboembolic disease (see series 9, image 123). Despite this the vessel remains patent to the bifurcation. At the bifurcation only mild stenosis occurs. Right MCA branches enhance and a relatively symmetric fashion. Venous sinuses: Early contrast timing, superior sagittal sinus is patent. Anatomic variants: Dominant left vertebral artery. Dominant left and diminutive or absent right ACA A1 segments. Fetal left PCA origin.  Review of the MIP images confirms the above findings IMPRESSION: 1. No large vessel occlusion, but there is moderate to severe intracranial atherosclerosis with: - Severe Right MCA M1 stenosis over a segment of 6 mm (series 7, image 43). - Moderate Left PCA P2, and moderate to severe bilateral distal PCA branch stenoses. - up to moderate Left MCA M2 stenoses. 2. Generalized arterial tortuosity. Little to no atherosclerosis in the neck. Electronically Signed: By: Odessa Fleming M.D. On: 11/16/2019 15:35   MR BRAIN WO CONTRAST  Result Date: 11/16/2019 CLINICAL DATA:  79 year old female  code stroke presentation. Dizziness and confusion. Intracranial atherosclerosis on CTA today. EXAM: MRI HEAD WITHOUT CONTRAST TECHNIQUE: Multiplanar, multiecho pulse sequences of the brain and surrounding structures were obtained without intravenous contrast. COMPARISON:  Head CT, CTA head and neck earlier today. FINDINGS: Brain: No restricted diffusion to suggest acute infarction. No midline shift, mass effect, evidence of mass lesion, extra-axial collection or acute intracranial hemorrhage. Cervicomedullary junction and pituitary are within normal limits. Stable ventricular prominence. No evidence of hyperdynamic flow at the cerebral aqueduct. Patchy and confluent left greater than right cerebral white matter T2 and FLAIR hyperintensity. Chronic lacunar infarct tracking from the left corona radiata into the posterior limb internal capsule. Associated hemosiderin. Wallerian degeneration at the left midbrain. No cortical encephalomalacia. No other chronic cerebral blood products. Small chronic infarct in the right basal ganglia. Vascular: Major intracranial vascular flow voids are preserved. Skull and upper cervical spine: Negative for age visible cervical spine. Normal bone marrow signal. Sinuses/Orbits: Postoperative changes to both globes, otherwise negative orbits. Paranasal Visualized paranasal sinuses and mastoids are stable and well pneumatized. Other: Grossly normal visible internal auditory structures. Scalp and face soft tissues appear negative. IMPRESSION: 1. No acute intracranial abnormality. 2. Moderately advanced chronic small vessel disease, most pronounced in the left deep white matter capsules with associated Wallerian degeneration. Electronically Signed   By: Odessa Fleming M.D.   On: 11/16/2019 21:13   DG Chest Portable 1 View  Result Date: 11/16/2019 CLINICAL DATA:  Dizziness and confusion since 11 a.m. EXAM: PORTABLE CHEST 1 VIEW COMPARISON:  None. FINDINGS: Single frontal view of the chest  demonstrates an unremarkable cardiac silhouette. There is ectasia of the thoracic aorta. No airspace disease, effusion, or pneumothorax. No acute bony abnormality. IMPRESSION: 1. No acute intrathoracic process. Electronically Signed   By: Sharlet Salina M.D.   On: 11/16/2019 15:52   ECHOCARDIOGRAM COMPLETE  Result Date: 11/18/2019    ECHOCARDIOGRAM REPORT   Patient Name:   XIARA KNISLEY Date of Exam: 11/18/2019 Medical Rec #:  [id number]       Height:       61.0 in Accession #:    [id number]      Weight:       139.6 lb Date of Birth:  03/23/1940        BSA:          1.621 m Patient Age:    79 years        BP:           130/81 mmHg Patient Gender: F               HR:           94 bpm. Exam Location:  [facility] Procedure: 2D Echo, Cardiac Doppler and Color Doppler Indications:     Stroke 434.91  History:         Patient has no prior history of Echocardiogram examinations.  Dementia.  Sonographer:     Cristela Blue RDCS (AE) Referring Phys:  [id number] Gillis Santa Diagnosing Phys: Julien Nordmann MD  Sonographer Comments: Technically challenging study due to limited acoustic windows, no apical window and no subcostal window. Pt was lying on right side-- could not communicate well enough to lie on left side. IMPRESSIONS  1. Left ventricular ejection fraction, by estimation, is 60 to 65%. The left ventricle has normal function. The left ventricle has no regional wall motion abnormalities. There is moderate left ventricular hypertrophy. Left ventricular diastolic parameters are indeterminate.  2. Right ventricular systolic function was not well visualized. The right ventricular size is normal. Tricuspid regurgitation signal is inadequate for assessing PA pressure.  3. The mitral valve is normal in structure. No evidence of mitral valve regurgitation. No evidence of mitral stenosis. Moderate mitral annular calcification.  4. The aortic valve is heaily calcified. Aortic valve regurgitation is trivial. gradient not  measured, unable to exclude mild aortic valve stenosis. FINDINGS  Left Ventricle: Left ventricular ejection fraction, by estimation, is 60 to 65%. The left ventricle has normal function. The left ventricle has no regional wall motion abnormalities. The left ventricular internal cavity size was normal in size. There is  moderate left ventricular hypertrophy. Left ventricular diastolic parameters are indeterminate. Right Ventricle: The right ventricular size is normal. No increase in right ventricular wall thickness. Right ventricular systolic function was not well visualized. Tricuspid regurgitation signal is inadequate for assessing PA pressure. Left Atrium: Left atrial size was normal in size. Right Atrium: Right atrial size was not well visualized. Pericardium: There is no evidence of pericardial effusion. Mitral Valve: The mitral valve is normal in structure. There is mild calcification of the mitral valve leaflet(s). Moderate mitral annular calcification. No evidence of mitral valve regurgitation. No evidence of mitral valve stenosis. Tricuspid Valve: The tricuspid valve is normal in structure. Tricuspid valve regurgitation is mild . No evidence of tricuspid stenosis. Aortic Valve: The aortic valve is normal in structure. Aortic valve regurgitation is trivial. Mild to moderate aortic valve sclerosis/calcification is present, without any evidence of aortic stenosis. Pulmonic Valve: The pulmonic valve was not well visualized. Pulmonic valve regurgitation is not visualized. No evidence of pulmonic stenosis. Aorta: The aortic arch was not well visualized and the ascending aorta was not well visualized. Venous: The pulmonary veins were not well visualized. The inferior vena cava is normal in size with greater than 50% respiratory variability, suggesting right atrial pressure of 3 mmHg. IAS/Shunts: No atrial level shunt detected by color flow Doppler.  LEFT VENTRICLE PLAX 2D LVIDd:         2.86 cm LVIDs:         1.75  cm LV PW:         0.94 cm LV IVS:        1.45 cm LVOT diam:     2.00 cm LVOT Area:     3.14 cm  LEFT ATRIUM         Index LA diam:    3.60 cm 2.22 cm/m                        PULMONIC VALVE AORTA                 PV Vmax:        0.60 m/s Ao Root diam: 2.90 cm PV Peak grad:   1.5 mmHg  RVOT Peak grad: 2 mmHg  TRICUSPID VALVE TR Peak grad:   19.5 mmHg TR Vmax:        221.00 cm/s  SHUNTS Systemic Diam: 2.00 cm Julien Nordmannimothy Gollan MD Electronically signed by Julien Nordmannimothy Gollan MD Signature Date/Time: 11/18/2019/1:43:40 PM    Final    CT HEAD CODE STROKE WO CONTRAST  Result Date: 11/16/2019 CLINICAL DATA:  Code stroke. Neuro deficit, acute stroke suspected. Dizziness and confusion. EXAM: CT HEAD WITHOUT CONTRAST TECHNIQUE: Contiguous axial images were obtained from the base of the skull through the vertex without intravenous contrast. COMPARISON:  None. FINDINGS: Brain: No evidence of acute large vascular territory infarction, hemorrhage, extra-axial collection or mass lesion/mass effect. Ventriculomegaly, which is slightly out of proportion to the degree of cerebral volume loss. Additionally, there is an acute callosal angle and mild crowding of sulci at the vertex. Remote appearing infarcts in bilateral basal ganglia. Patchy white matter hypodensities, most likely the sequela of chronic microvascular ischemic disease. Diffuse cerebral atrophy. Vascular: Calcific atherosclerosis. No convincing hyperdense vessel. Mild hyperdensity of the basilar is favored to secondary to streak artifact in this region. Skull: No acute fracture. Sinuses/Orbits: No acute findings. Other: No mastoid effusions. ASPECTS [facility]([place] Stroke Program Early CT Score) total score (0-10 with 10 being normal): 10 IMPRESSION: 1. No evidence of acute intracranial abnormality.  ASPECTS is 10. 2. Remote appearing lacunar infarcts in bilateral basal ganglia. 3. Ventriculomegaly, which is slightly out of proportion to the degree of cerebral  volume loss. Additionally, there is an acute callosal angle and mild crowding of sulci at the vertex. Overall, findings are nonspecific but can be seen with normal pressure hydrocephalus in the correct clinical setting. Code stroke imaging results were communicated on 11/16/2019 at 2:59 pm to provider Dr. Fanny BienQuale Via telephone, who verbally acknowledged these results. Electronically Signed   By: Feliberto HartsFrederick S Jones MD   On: 11/16/2019 15:04    Microbiology: Recent Results (from the past 240 hour(s))  Respiratory Panel by RT PCR (Flu A&B, Covid) - Nasopharyngeal Swab     Status: None   Collection Time: 11/16/19  3:45 PM   Specimen: Nasopharyngeal Swab  Result Value Ref Range Status   SARS Coronavirus 2 by RT PCR NEGATIVE NEGATIVE Final    Comment: (NOTE) SARS-CoV-2 target nucleic acids are NOT DETECTED.  The SARS-CoV-2 RNA is generally detectable in upper respiratoy specimens during the acute phase of infection. The lowest concentration of SARS-CoV-2 viral copies this assay can detect is 131 copies/mL. A negative result does not preclude SARS-Cov-2 infection and should not be used as the sole basis for treatment or other patient management decisions. A negative result may occur with  improper specimen collection/handling, submission of specimen other than nasopharyngeal swab, presence of viral mutation(s) within the areas targeted by this assay, and inadequate number of viral copies (<131 copies/mL). A negative result must be combined with clinical observations, patient history, and epidemiological information. The expected result is Negative.  Fact Sheet for Patients:  https://www.moore.com/https://www.fda.gov/media/142436/download  Fact Sheet for Healthcare Providers:  https://www.young.biz/https://www.fda.gov/media/142435/download  This test is no t yet approved or cleared by the [place] FDA and  has been authorized for detection and/or diagnosis of SARS-CoV-2 by FDA under an Emergency Use Authorization (EUA). This EUA  will remain  in effect (meaning this test can be used) for the duration of the COVID-19 declaration under Section 564(b)(1) of the Act, 21 U.S.C. section 360bbb-3(b)(1), unless the authorization is terminated or revoked sooner.     Influenza A by  PCR NEGATIVE NEGATIVE Final   Influenza B by PCR NEGATIVE NEGATIVE Final    Comment: (NOTE) The Xpert Xpress SARS-CoV-2/FLU/RSV assay is intended as an aid in  the diagnosis of influenza from Nasopharyngeal swab specimens and  should not be used as a sole basis for treatment. Nasal washings and  aspirates are unacceptable for Xpert Xpress SARS-CoV-2/FLU/RSV  testing.  Fact Sheet for Patients: https://www.moore.com/  Fact Sheet for Healthcare Providers: https://www.young.biz/  This test is not yet approved or cleared by the [place] FDA and  has been authorized for detection and/or diagnosis of SARS-CoV-2 by  FDA under an Emergency Use Authorization (EUA). This EUA will remain  in effect (meaning this test can be used) for the duration of the  Covid-19 declaration under Section 564(b)(1) of the Act, 21  U.S.C. section 360bbb-3(b)(1), unless the authorization is  terminated or revoked. Performed at [facility], [address] [place], [place] [address]      Labs: Basic Metabolic Panel: Recent Labs  Lab 11/16/19 1506 11/16/19 1545 11/17/19 0513  NA  --  133* 135  K  --  4.4 3.8  CL  --  99 104  CO2  --  23 23  GLUCOSE  --  156* 150*  BUN  --  14 13  CREATININE 0.70 0.71 0.72  CALCIUM  --  9.0 9.0  MG  --   --  2.0  PHOS  --   --  3.2   Liver Function Tests: Recent Labs  Lab 11/16/19 1545  AST 29  ALT 20  ALKPHOS 74  BILITOT 1.0  PROT 7.7  ALBUMIN 3.8   No results for input(s): LIPASE, AMYLASE in the last 168 hours. Recent Labs  Lab 11/16/19 1648  AMMONIA 17   CBC: Recent Labs  Lab 11/16/19 1545 11/17/19 0513  WBC 10.8* 8.8  NEUTROABS 9.4*  --   HGB  14.7 14.2  HCT 45.1 42.9  MCV 84.6 84.4  PLT 285 265   Cardiac Enzymes: No results for input(s): CKTOTAL, CKMB, CKMBINDEX, TROPONINI in the last 168 hours. BNP: BNP (last 3 results) No results for input(s): BNP in the last 8760 hours.  ProBNP (last 3 results) No results for input(s): PROBNP in the last 8760 hours.  CBG: Recent Labs  Lab 11/17/19 0754 11/17/19 1143 11/17/19 1636 11/17/19 1955 11/18/19 0754  GLUCAP 151* 137* 112* 121* 145*       Signed:  Briant Cedar, MD [facility] 11/18/2019, 2:55 PM

## 2019-11-17 NOTE — Progress Notes (Addendum)
CSW called [facility] ([facility]). Spoke with Home Care Nurse Silvio Pate. Informed her of PT and OT recommendations. She reported their staff will set up whatever patient needs when she returns home. She reported CSW does not need to set up Home Health or any other services. She reported [facility] can provide patient with transport home at discharge as long as its M-F 8-5, otherwise she reported EMS transport would need to be set up.  3:18- Dr. Autumn Messing requested a call from this CSW. CSW called [facility]. Dr. Autumn Messing not there. Left CSW's number for Dr. Autumn Messing to call if needed.  3:30- Call from Dr. Autumn Messing [phone]). Provided update. She reported she will ensure patient gets PT, OT, and will follow up about vestibular rehab need as well. She asked about results of EEG and echo, CSW sent message to MD And RN and asked them to call Dr. Autumn Messing with these results. Dr. Autumn Messing requested a call to her phone when patient is ready for discharge as well so she can arrange transport.  Alfonso Ramus, [place] [phone]

## 2019-11-17 NOTE — Evaluation (Signed)
Physical Therapy Evaluation Patient Details Name: Rachel Yang MRN: [id number] DOB: 08/13/1940 Today's Date: 11/17/2019   History of Present Illness  Pt is a 79 yo F admitted to [facility] on 11/10 under observation for stroke-like symptoms including: difficulty speaking, confusion, and dizziness. Code stroke initiated upon admission, but TPA deferred based on low NIH score. Significant PMH includes: dementia, hx CVA (39yrs ago), HLD, hx of NIDDM T2, depression, heart murmur. 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    Clinical Impression  Pt is a 79 year old F admitted to hospital on 11/16/19 for stroke-like symptoms, to which imaging revealed bil lacunar infarct in basal ganglia. Spanish interpreter utilized during session for maximized pt participation and safety. At baseline, pt required assistance for IADLs, but was mod I with ADL's and limited ambulation with rollator. Pt presents with generalized weakness, impaired processing, impaired motor control/coordination/planning, poor safety awareness, dizziness with positional changes, intermittent L inattention, and possible vestibular involvement, resulting in impaired functional mobility from baseline. Due to deficits, pt required min assist for bed mobility, min assist for transfers, and CGA for safety for short distance ambulation with RW. Due to poor safety awareness, pt required max multimodal cues with mobility, including locking/unlocking rollator breaks, obstacle negotiation, and AD proximity to ensure safety with all mobility. Vestibular screen limited, however, pt demonstrated decreased tracking with smooth pursuits and corrective saccade to the R; coupled with c/o dizziness with positional changes, this could indicate possible vestibular involvement. Deficits  limit the pt's ability to safely and independently perform ADL's, transfer, and ambulate. Pt will benefit from acute skilled PT services to address deficits for return to baseline function. At this time, PT recommends DC home with HHPT to address deficits and 24/7 supervision and assistance to ensure pt safety with all mobility.     Follow Up Recommendations Home health PT;Supervision/Assistance - 24 hour    Equipment Recommendations  None recommended by PT    Recommendations for Other Services       Precautions / Restrictions Precautions Precautions: Fall      Mobility  Bed Mobility Overal bed mobility: Needs Assistance Bed Mobility: Supine to Sit     Supine to sit: Min assist;HOB elevated     General bed mobility comments: Min assist for trunk and hip facilitation to sit EOB with HOB elevated and increased reliance of UE on PT and bed rail for support. Mod cueing for sequencing to ensure safety with transfer.    Transfers Overall transfer level: Needs assistance Equipment used: 4-wheeled walker Transfers: Sit to/from [facility] Transfers Sit to Stand: +2 safety/equipment;Min assist Stand pivot transfers: +2 safety/equipment       General transfer comment: Min assist for power to stand from EOB and BSC with rollator; +2 for safety/equipment management. Multimodal cues provided for sequencing and hand placement.  Ambulation/Gait Ambulation/Gait assistance: Min guard;+2 safety/equipment Gait Distance (Feet): 80 Feet Assistive device: 4-wheeled walker       General Gait Details: Pt required CGA for safety to ambulate 40ft with Rollator; +2 for safety/equipment management. Pt demonstrated fast unsteady step to gait, with decreased L step length and decreased R stance time, and decreased bil heel strike. Pt demonstrated poor safety awareness and poor motor planning with 180deg turn. Pt demonstrated L inattention when returning to room due to left veering of rollator  into wall, requiring multimodal cues for  rollator proximity and obstacle negotiation.  Stairs            Wheelchair Mobility    Modified Rankin (Stroke Patients Only)       Balance Overall balance assessment: Needs assistance Sitting-balance support: Feet supported;No upper extremity supported Sitting balance-Leahy Scale: Good Sitting balance - Comments: no LOB while seated EOB or on BSC   Standing balance support: During functional activity;Bilateral upper extremity supported Standing balance-Leahy Scale: Fair Standing balance comment: Fair standing balance in RW due to intermittent L inattention, decreased rollator proximity, and poor safety awareness requiring CGA for safety                             Pertinent Vitals/Pain Pain Assessment: No/denies pain    Home Living Family/patient expects to be discharged to:: Private residence Living Arrangements: Spouse/significant other;Children Available Help at Discharge: Available 24 hours/day;Family (Spouse available 24/7, son works during day and available PRN) Type of Home: House Home Access: Stairs to enter Entrance Stairs-Rails: Doctor, general practice of Steps: 3 Home Layout: Two level;Able to live on main level with bedroom/bathroom Home Equipment: Walker - 4 wheels;Grab bars - toilet;Grab bars - tub/shower;Shower seat      Prior Function Level of Independence: Independent with assistive device(s)         Comments: I with ADLs and mobility with use of rollator. Family assists with IADL tasks as needed.     Hand Dominance   Dominant Hand: Right    Extremity/Trunk Assessment   Upper Extremity Assessment Upper Extremity Assessment: Defer to OT evaluation RUE Deficits / Details: 3+/5 gross strength RUE Coordination: decreased fine motor LUE Deficits / Details: 3+/5 gross strength LUE Coordination: decreased fine motor;decreased gross motor    Lower Extremity Assessment Lower  Extremity Assessment: Generalized weakness;LLE deficits/detail;RLE deficits/detail RLE Deficits / Details: Grossly 3+/5; decreased planning, control, and coordination RLE Coordination: decreased gross motor LLE Deficits / Details: Grossly 3+/5; decreased planning, control, and coordination LLE Coordination: decreased gross motor    Cervical / Trunk Assessment Cervical / Trunk Assessment: Kyphotic  Communication   Communication: Prefers language other than English;Other (comment);Interpreter utilized (spanish speaking)  Cognition Arousal/Alertness: Awake/alert Behavior During Therapy: Flat affect Overall Cognitive Status: Difficult to assess                                 General Comments: Difficult to assess secondary to language and communication deficits. Pt appears to be oriented to self and location. Pt has slow processing for motor tasks and when answering questions. Pt needing min cuing for redirection of task and also displays some inattention to the L during session with self care tasks.      General Comments General comments (skin integrity, edema, etc.): Pt unable to perform vestibular screen despite interpeter directions. Pt initially able to track to the L with smooth pursuits, demonstrating fast corrective saccade to the R. Saccade testing unremarkable.    Exercises Other Exercises Other Exercises: Pt able to take ~6 steps from [facility] for toileting. Pt able to stand and perform self care ADL's with wide BOS, forward lean, and unilateral UE support on Rollator. Pt required max multimodal cues for locking/unlocking rollator breaks with transfers and mobility.   Assessment/Plan    PT Assessment Patient needs continued PT services  PT Problem List Decreased strength;Decreased mobility;Decreased safety awareness;Decreased coordination;Decreased activity tolerance;Decreased cognition;Decreased balance  PT Treatment Interventions Gait training;Stair  training;Functional mobility training;Therapeutic activities;Therapeutic exercise;Balance training;Neuromuscular re-education;Patient/family education    PT Goals (Current goals can be found in the Care Plan section)  Acute Rehab PT Goals Patient Stated Goal: to go home PT Goal Formulation: With patient Time For Goal Achievement: 12/01/19 Potential to Achieve Goals: Good    Frequency 7X/week   Barriers to discharge        Co-evaluation PT/OT/SLP Co-Evaluation/Treatment: Yes Reason for Co-Treatment: For patient/therapist safety;To address functional/ADL transfers;Necessary to address cognition/behavior during functional activity PT goals addressed during session: Mobility/safety with mobility;Balance OT goals addressed during session: ADL's and self-care;Proper use of Adaptive equipment and DME       AM-PAC PT "6 Clicks" Mobility  Outcome Measure Help needed turning from your back to your side while in a flat bed without using bedrails?: A Little Help needed moving from lying on your back to sitting on the side of a flat bed without using bedrails?: A Little Help needed moving to and from a bed to a chair (including a wheelchair)?: A Little Help needed standing up from a chair using your arms (e.g., wheelchair or bedside chair)?: A Little Help needed to walk in hospital room?: A Little Help needed climbing 3-5 steps with a railing? : A Lot 6 Click Score: 17    End of Session Equipment Utilized During Treatment: Gait belt Activity Tolerance: Patient tolerated treatment well Patient left: in chair;with call bell/phone within reach;with chair alarm set Nurse Communication: Mobility status PT Visit Diagnosis: Unsteadiness on feet (R26.81);Muscle weakness (generalized) (M62.81);Difficulty in walking, not elsewhere classified (R26.2);Other symptoms and signs involving the nervous system (M22.633)    Time: 3545-6256 PT Time Calculation (min) (ACUTE ONLY): 47 min   Charges:   PT  Evaluation $PT Eval Moderate Complexity: 1 Mod PT Treatments $Therapeutic Activity: 8-22 mins       Vira Blanco, PT, DPT 12:05 PM,11/17/19

## 2019-11-17 NOTE — Progress Notes (Signed)
eeg done °

## 2019-11-17 NOTE — Progress Notes (Signed)
No IV access. MD aware.

## 2019-11-17 NOTE — Progress Notes (Signed)
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 In the ED, pt symptoms had improved. Patient denied any focal deficit, no sensory deficit, no vision changes.Denied any palpitations, no chest pain, no shortness of breath. In the ED, noted mild hyponatremia sodium 133, mildly elevated WBC count 10.8 could be reactive leukocytosis mild hyperglycemia blood glucose 156, TSH 3.6 with normal range, Covid and flu negative.  CT head negative, CTA multifocal intracranial atherosclerosis.  MRI negative for acute ischemia. Patient admitted for further management     Today, patient denied any new complaints, although ROS was limited by dementia.  Patient was able to answer simple questions appropriately.  No obvious focal neurologic deficits was noted.  Assessment/Plan: Active Problems:   Stroke-like symptom   Likely TIA Prior history of CVA Noted to have difficulty in finding words, dizziness CTA head/neck showed severe atherosclerosis MRI brain negative for any acute infarct EEG suggestive of mild diffuse encephalopathy, no seizures or epileptiform discharges were seen Echo pending LDL 234, A1c 7.5 Neurology consulted, recommend dual antiplatelets aspirin plus Plavix for 3 weeks, and then Plavix alone PT/OT/SLP recommend home health, [facility], and all of this will be arranged via [facility]  Hyperlipidemia LDL 234 Start Lipitor 80 mg daily, PCP follow-up LFTs  Diabetes mellitus type 2 A1c 7.5 Continue SSI, accuchecks, hypoglycemic protocol Plan to start Metformin upon DC, and should be followed up closely  by PCP  History of dementia Continue supportive care         Malnutrition Type:      Malnutrition Characteristics:      Nutrition Interventions:       Estimated body mass index is 26.37 kg/m as calculated from the following:   Height as of this encounter: 5\' 1"  (1.549 m).   Weight as of this encounter: 63.3 kg.     Code Status: Full  Family Communication: None at bedside  Disposition Plan: Status is: Observation  The patient remains OBS appropriate and will d/c before 2 midnights.  Dispo: The patient is from: Home              Anticipated d/c is to: Home              Anticipated d/c date is: 1 day              Patient currently is not medically stable to d/c.    Consultants:  Neurology  Procedures:  None  Antimicrobials:  None  DVT prophylaxis: Lovenox   Objective: Vitals:   11/17/19 0444 11/17/19 0758 11/17/19 0800 11/17/19 1637  BP: 102/73 109/70  108/63  Pulse: 96 84  73  Resp: 18 18  16   Temp: 98.4 F (36.9 C) 98.3 F (36.8 C)  (!) 97.4 F (36.3 C)  TempSrc:  Oral    SpO2: 94% 96%  96%  Weight:   63.3 kg   Height:   5\' 1"  (1.549 m)     Intake/Output Summary (Last 24 hours) at 11/17/2019 1640 Last data filed at 11/17/2019 1500 Gross per 24 hour  Intake 193.34 ml  Output --  Net 193.34 ml   Filed Weights   11/17/19  0800  Weight: 63.3 kg    Exam:  General: NAD, disoriented at baseline  Cardiovascular: S1, S2 present  Respiratory: CTAB  Abdomen: Soft, nontender, nondistended, bowel sounds present  Musculoskeletal: No bilateral pedal edema noted  Skin: Normal  Psychiatry: Normal mood  Neurology: No obvious focal neurologic deficits, strength equal in all extremities   Data Reviewed: CBC: Recent Labs  Lab 11/16/19 1545 11/17/19 0513  WBC 10.8* 8.8  NEUTROABS 9.4*  --   HGB 14.7 14.2  HCT 45.1 42.9  MCV 84.6 84.4  PLT 285 265   Basic Metabolic Panel: Recent Labs  Lab 11/16/19 1506  11/16/19 1545 11/17/19 0513  NA  --  133* 135  K  --  4.4 3.8  CL  --  99 104  CO2  --  23 23  GLUCOSE  --  156* 150*  BUN  --  14 13  CREATININE 0.70 0.71 0.72  CALCIUM  --  9.0 9.0  MG  --   --  2.0  PHOS  --   --  3.2   GFR: Estimated Creatinine Clearance: 47 mL/min (by C-G formula based on SCr of 0.72 mg/dL). Liver Function Tests: Recent Labs  Lab 11/16/19 1545  AST 29  ALT 20  ALKPHOS 74  BILITOT 1.0  PROT 7.7  ALBUMIN 3.8   No results for input(s): LIPASE, AMYLASE in the last 168 hours. Recent Labs  Lab 11/16/19 1648  AMMONIA 17   Coagulation Profile: Recent Labs  Lab 11/16/19 1545  INR 0.9   Cardiac Enzymes: No results for input(s): CKTOTAL, CKMB, CKMBINDEX, TROPONINI in the last 168 hours. BNP (last 3 results) No results for input(s): PROBNP in the last 8760 hours. HbA1C: Recent Labs    11/16/19 1711  HGBA1C 7.5*   CBG: Recent Labs  Lab 11/16/19 1440 11/17/19 0754 11/17/19 1143 11/17/19 1636  GLUCAP 182* 151* 137* 112*   Lipid Profile: Recent Labs    11/17/19 0513  CHOL 314*  HDL 48  LDLCALC 234*  TRIG 158*  CHOLHDL 6.5   Thyroid Function Tests: Recent Labs    11/16/19 1648  TSH 3.636   Anemia Panel: No results for input(s): VITAMINB12, FOLATE, FERRITIN, TIBC, IRON, RETICCTPCT in the last 72 hours. Urine analysis:    Component Value Date/Time   COLORURINE YELLOW (A) 11/16/2019 2024   APPEARANCEUR CLEAR (A) 11/16/2019 2024   LABSPEC 1.039 (H) 11/16/2019 2024   PHURINE 6.0 11/16/2019 2024   GLUCOSEU NEGATIVE 11/16/2019 2024   HGBUR NEGATIVE 11/16/2019 2024   BILIRUBINUR NEGATIVE 11/16/2019 2024   KETONESUR NEGATIVE 11/16/2019 2024   PROTEINUR NEGATIVE 11/16/2019 2024   NITRITE NEGATIVE 11/16/2019 2024   LEUKOCYTESUR NEGATIVE 11/16/2019 2024   Sepsis Labs: @LABRCNTIP (procalcitonin:4,lacticidven:4)  ) Recent Results (from the past 240 hour(s))  Respiratory Panel by RT PCR (Flu A&B, Covid) - Nasopharyngeal Swab      Status: None   Collection Time: 11/16/19  3:45 PM   Specimen: Nasopharyngeal Swab  Result Value Ref Range Status   SARS Coronavirus 2 by RT PCR NEGATIVE NEGATIVE Final    Comment: (NOTE) SARS-CoV-2 target nucleic acids are NOT DETECTED.  The SARS-CoV-2 RNA is generally detectable in upper respiratoy specimens during the acute phase of infection. The lowest concentration of SARS-CoV-2 viral copies this assay can detect is 131 copies/mL. A negative result does not preclude SARS-Cov-2 infection and should not be used as the sole basis for treatment or other patient management decisions. A negative result may occur with  improper specimen collection/handling, submission of specimen other than nasopharyngeal swab, presence of viral mutation(s) within the areas targeted by this assay, and inadequate number of viral copies (<131 copies/mL). A negative result must be combined with clinical observations, patient history, and epidemiological information. The expected result is Negative.  Fact Sheet for Patients:  https://www.moore.com/  Fact Sheet for Healthcare Providers:  https://www.young.biz/  This test is no t yet approved or cleared by the [place] FDA and  has been authorized for detection and/or diagnosis of SARS-CoV-2 by FDA under an Emergency Use Authorization (EUA). This EUA will remain  in effect (meaning this test can be used) for the duration of the COVID-19 declaration under Section 564(b)(1) of the Act, 21 U.S.C. section 360bbb-3(b)(1), unless the authorization is terminated or revoked sooner.     Influenza A by PCR NEGATIVE NEGATIVE Final   Influenza B by PCR NEGATIVE NEGATIVE Final    Comment: (NOTE) The Xpert Xpress SARS-CoV-2/FLU/RSV assay is intended as an aid in  the diagnosis of influenza from Nasopharyngeal swab specimens and  should not be used as a sole basis for treatment. Nasal washings and  aspirates are  unacceptable for Xpert Xpress SARS-CoV-2/FLU/RSV  testing.  Fact Sheet for Patients: https://www.moore.com/  Fact Sheet for Healthcare Providers: https://www.young.biz/  This test is not yet approved or cleared by the [place] FDA and  has been authorized for detection and/or diagnosis of SARS-CoV-2 by  FDA under an Emergency Use Authorization (EUA). This EUA will remain  in effect (meaning this test can be used) for the duration of the  Covid-19 declaration under Section 564(b)(1) of the Act, 21  U.S.C. section 360bbb-3(b)(1), unless the authorization is  terminated or revoked. Performed at [facility], [address] [place] [place], [place] [address]       Studies: EEG  Result Date: 11/17/2019 Charlsie Quest, MD     11/17/2019  3:42 PM Patient Name: Rachel Yang MRN: [id number] Epilepsy Attending: Charlsie Quest Referring Physician/Provider: Dr Andreas Newport Date: 11/17/2019 Duration: 28.14 mins Patient history: 79 year old female with altered mental status and dizziness. EEG to evaluate for seizures. Level of alertness: Awake, asleep AEDs during EEG study: None Technical aspects: This EEG study was done with scalp electrodes positioned according to the 10-20 International system of electrode placement. Electrical activity was acquired at a sampling rate of 500Hz  and reviewed with a high frequency filter of 70Hz  and a low frequency filter of 1Hz . EEG data were recorded continuously and digitally stored. Description: The posterior dominant rhythm consists of 9-10 Hz activity of moderate voltage (25-35 uV) seen predominantly in posterior head regions, symmetric and reactive to eye opening and eye closing. Sleep was characterized by vertex waves, sleep spindles (12 to 14 Hz), maximal frontocentral region.  EEG showed intermittent generalized 3 to 6 Hz theta-delta slowing.  Single sharp transient was seen in left temporal region.   Physiologic photic driving was not seen during photic stimulation.  Hyperventilation was not performed.   ABNORMALITY -Intermittent slow, generalized IMPRESSION: This study is suggestive of mild diffuse encephalopathy, nonspecific etiology. No seizures or epileptiform discharges were seen throughout the recording.   MR BRAIN WO CONTRAST  Result Date: 11/16/2019 CLINICAL DATA:  79 year old female code stroke presentation. Dizziness and confusion. Intracranial atherosclerosis on CTA today. EXAM: MRI HEAD WITHOUT CONTRAST TECHNIQUE: Multiplanar, multiecho pulse sequences of the brain and surrounding structures were obtained without intravenous contrast. COMPARISON:  Head CT, CTA head and neck earlier today. FINDINGS: Brain:  No restricted diffusion to suggest acute infarction. No midline shift, mass effect, evidence of mass lesion, extra-axial collection or acute intracranial hemorrhage. Cervicomedullary junction and pituitary are within normal limits. Stable ventricular prominence. No evidence of hyperdynamic flow at the cerebral aqueduct. Patchy and confluent left greater than right cerebral white matter T2 and FLAIR hyperintensity. Chronic lacunar infarct tracking from the left corona radiata into the posterior limb internal capsule. Associated hemosiderin. Wallerian degeneration at the left midbrain. No cortical encephalomalacia. No other chronic cerebral blood products. Small chronic infarct in the right basal ganglia. Vascular: Major intracranial vascular flow voids are preserved. Skull and upper cervical spine: Negative for age visible cervical spine. Normal bone marrow signal. Sinuses/Orbits: Postoperative changes to both globes, otherwise negative orbits. Paranasal Visualized paranasal sinuses and mastoids are stable and well pneumatized. Other: Grossly normal visible internal auditory structures. Scalp and face soft tissues appear negative. IMPRESSION: 1. No acute intracranial  abnormality. 2. Moderately advanced chronic small vessel disease, most pronounced in the left deep white matter capsules with associated Wallerian degeneration. Electronically Signed   By: Odessa Fleming M.D.   On: 11/16/2019 21:13    Scheduled Meds: . aspirin  81 mg Oral Daily  . atorvastatin  80 mg Oral QPM  . clopidogrel  75 mg Oral Daily  . enoxaparin (LOVENOX) injection  40 mg Subcutaneous Q24H  . insulin aspart  0-5 Units Subcutaneous QHS  . insulin aspart  0-9 Units Subcutaneous TID WC  . sodium chloride flush  3 mL Intravenous Once    Continuous Infusions:   LOS: 0 days     Briant Cedar, MD [facility]  If 7PM-7AM, please contact night-coverage [email or web address] 11/17/2019, 4:40 PM

## 2019-11-17 NOTE — Progress Notes (Signed)
Subjective: No significant issues overnight  Exam: Vitals:   11/17/19 0758 11/17/19 1637  BP: 109/70 108/63  Pulse: 84 73  Resp: 18 16  Temp: 98.3 F (36.8 C) (!) 97.4 F (36.3 C)  SpO2: 96% 96%   Gen: In bed, NAD Resp: non-labored breathing, no acute distress Abd: soft, nt  Neuro: Awake, alert, working with physical therapy, exam is slightly limited due to working with physical therapy, but she is able to stand much more readily than she was yesterday.  Pertinent Labs: Creatinine 0.7 LDL 234 CTA with multifocal severe intracranial atherosclerosis, but no occlusions MRI brain-negative for stroke  Impression: 79 year old female with transient aphasia and unsteadiness which appears to be continuing to improve.  At this point, given her severe intracranial atherosclerosis, I think that TIA is a very significant possibility.  Seizure was considered, but without clear evidence of this, and negative EEG, I think TIA is more likely.  I would favor aggressive risk factor control.  Recommendations: 1) ASA 81 mg plus Plavix 75 mg daily x3 weeks followed by Plavix monotherapy 2) agree with high intensity statin 3) she can follow-up as an outpatient with neurology  Ritta Slot, MD [facility] [phone]  If 7pm- 7am, please page neurology on call as listed in AMION.

## 2019-11-17 NOTE — Plan of Care (Signed)
  Problem: Education: Goal: Knowledge of disease or condition will improve Outcome: Progressing Goal: Knowledge of secondary prevention will improve Outcome: Progressing Goal: Knowledge of patient specific risk factors addressed and post discharge goals established will improve Outcome: Progressing Goal: Individualized Educational Video(s) Outcome: Progressing   Problem: Coping: Goal: Will verbalize positive feelings about self Outcome: Progressing Goal: Will identify appropriate support needs Outcome: Progressing   Problem: Education: Goal: Knowledge of disease or condition will improve Outcome: Progressing Goal: Knowledge of secondary prevention will improve Outcome: Progressing Goal: Knowledge of patient specific risk factors addressed and post discharge goals established will improve Outcome: Progressing Goal: Individualized Educational Video(s) Outcome: Progressing   Problem: Coping: Goal: Will verbalize positive feelings about self Outcome: Progressing Goal: Will identify appropriate support needs Outcome: Progressing

## 2019-11-17 NOTE — Progress Notes (Signed)
Patient is unable to participate in admission profile due to aphasia and AMS.

## 2019-11-17 NOTE — Plan of Care (Signed)
  Problem: Education: Goal: Knowledge of disease or condition will improve Outcome: Progressing Goal: Knowledge of secondary prevention will improve Outcome: Progressing Goal: Knowledge of patient specific risk factors addressed and post discharge goals established will improve Outcome: Progressing Goal: Individualized Educational Video(s) Outcome: Progressing   Problem: Coping: Goal: Will verbalize positive feelings about self Outcome: Progressing Goal: Will identify appropriate support needs Outcome: Progressing   Problem: Education: Goal: Knowledge of General Education information will improve Description: Including pain rating scale, medication(s)/side effects and non-pharmacologic comfort measures Outcome: Progressing   Problem: Health Behavior/Discharge Planning: Goal: Ability to manage health-related needs will improve Outcome: Progressing   Problem: Self-Care: Goal: Ability to participate in self-care as condition permits will improve Outcome: Progressing Goal: Ability to communicate needs accurately will improve Outcome: Progressing   Problem: Nutrition: Goal: Risk of aspiration will decrease Outcome: Progressing   Problem: Ischemic Stroke/TIA Tissue Perfusion: Goal: Complications of ischemic stroke/TIA will be minimized Outcome: Progressing

## 2019-11-18 ENCOUNTER — Observation Stay (HOSPITAL_BASED_OUTPATIENT_CLINIC_OR_DEPARTMENT_OTHER)
Admit: 2019-11-18 | Discharge: 2019-11-18 | Disposition: A | Discharge status: Home / Self Care | Payer: Medicare (Managed Care) | Attending: Student | Admitting: Student

## 2019-11-18 DIAGNOSIS — E1169 Type 2 diabetes mellitus with other specified complication: Secondary | ICD-10-CM | POA: Diagnosis not present

## 2019-11-18 DIAGNOSIS — I361 Nonrheumatic tricuspid (valve) insufficiency: Secondary | ICD-10-CM | POA: Diagnosis not present

## 2019-11-18 DIAGNOSIS — G459 Transient cerebral ischemic attack, unspecified: Secondary | ICD-10-CM | POA: Diagnosis not present

## 2019-11-18 DIAGNOSIS — F0390 Unspecified dementia without behavioral disturbance: Secondary | ICD-10-CM | POA: Diagnosis not present

## 2019-11-18 DIAGNOSIS — I6389 Other cerebral infarction: Secondary | ICD-10-CM | POA: Diagnosis not present

## 2019-11-18 DIAGNOSIS — E785 Hyperlipidemia, unspecified: Secondary | ICD-10-CM | POA: Diagnosis not present

## 2019-11-18 LAB — PROTEIN ELECTRO, RANDOM URINE
Albumin ELP, Urine: 100 %
Alpha-1-Globulin, U: 0 %
Alpha-2-Globulin, U: 0 %
Beta Globulin, U: 0 %
Gamma Globulin, U: 0 %
Total Protein, Urine: 9.4 mg/dL

## 2019-11-18 LAB — ECHOCARDIOGRAM COMPLETE
Height: 61 in
S' Lateral: 1.75 cm
Weight: 2232.82 oz

## 2019-11-18 LAB — GLUCOSE, CAPILLARY: Glucose-Capillary: 145 mg/dL — ABNORMAL HIGH (ref 70–99)

## 2019-11-18 NOTE — Plan of Care (Signed)
  Problem: Education: Goal: Knowledge of disease or condition will improve 11/18/2019 0919 by Dillard Essex, RN Outcome: Adequate for Discharge 11/18/2019 0917 by Dillard Essex, RN Outcome: Progressing Goal: Knowledge of secondary prevention will improve 11/18/2019 0919 by Dillard Essex, RN Outcome: Adequate for Discharge 11/18/2019 0917 by Dillard Essex, RN Outcome: Progressing Goal: Knowledge of patient specific risk factors addressed and post discharge goals established will improve 11/18/2019 0919 by Dillard Essex, RN Outcome: Adequate for Discharge 11/18/2019 0917 by Dillard Essex, RN Outcome: Progressing Goal: Individualized Educational Video(s) 11/18/2019 0919 by Dillard Essex, RN Outcome: Adequate for Discharge 11/18/2019 0917 by Dillard Essex, RN Outcome: Progressing   Problem: Coping: Goal: Will verbalize positive feelings about self 11/18/2019 0919 by Dillard Essex, RN Outcome: Adequate for Discharge 11/18/2019 0917 by Dillard Essex, RN Outcome: Progressing Goal: Will identify appropriate support needs 11/18/2019 0919 by Dillard Essex, RN Outcome: Adequate for Discharge 11/18/2019 0917 by Dillard Essex, RN Outcome: Progressing   Problem: Education: Goal: Knowledge of General Education information will improve Description: Including pain rating scale, medication(s)/side effects and non-pharmacologic comfort measures 11/18/2019 0919 by Dillard Essex, RN Outcome: Adequate for Discharge 11/18/2019 0917 by Dillard Essex, RN Outcome: Progressing   Problem: Health Behavior/Discharge Planning: Goal: Ability to manage health-related needs will improve 11/18/2019 0919 by Dillard Essex, RN Outcome: Adequate for Discharge 11/18/2019 0917 by Dillard Essex, RN Outcome: Progressing   Problem: Self-Care: Goal: Ability to participate in self-care as condition permits will improve 11/18/2019 0919 by Dillard Essex,  RN Outcome: Adequate for Discharge 11/18/2019 0917 by Dillard Essex, RN Outcome: Progressing Goal: Ability to communicate needs accurately will improve 11/18/2019 0919 by Dillard Essex, RN Outcome: Adequate for Discharge 11/18/2019 1007 by Dillard Essex, RN Outcome: Progressing   Problem: Nutrition: Goal: Risk of aspiration will decrease 11/18/2019 0919 by Dillard Essex, RN Outcome: Adequate for Discharge 11/18/2019 0917 by Dillard Essex, RN Outcome: Progressing   Problem: Ischemic Stroke/TIA Tissue Perfusion: Goal: Complications of ischemic stroke/TIA will be minimized 11/18/2019 0919 by Dillard Essex, RN Outcome: Adequate for Discharge 11/18/2019 0917 by Dillard Essex, RN Outcome: Progressing

## 2019-11-18 NOTE — Progress Notes (Signed)
*  PRELIMINARY RESULTS* Echocardiogram 2D Echocardiogram has been performed.  Rachel Yang 11/18/2019, 8:26 AM

## 2019-11-18 NOTE — Plan of Care (Signed)
  Problem: Education: Goal: Knowledge of disease or condition will improve Outcome: Progressing Goal: Knowledge of secondary prevention will improve Outcome: Progressing Goal: Knowledge of patient specific risk factors addressed and post discharge goals established will improve Outcome: Progressing Goal: Individualized Educational Video(s) Outcome: Progressing   Problem: Coping: Goal: Will verbalize positive feelings about self Outcome: Progressing Goal: Will identify appropriate support needs Outcome: Progressing   Problem: Education: Goal: Knowledge of General Education information will improve Description: Including pain rating scale, medication(s)/side effects and non-pharmacologic comfort measures Outcome: Progressing   Problem: Health Behavior/Discharge Planning: Goal: Ability to manage health-related needs will improve Outcome: Progressing   Problem: Self-Care: Goal: Ability to participate in self-care as condition permits will improve Outcome: Progressing Goal: Ability to communicate needs accurately will improve Outcome: Progressing   Problem: Nutrition: Goal: Risk of aspiration will decrease Outcome: Progressing   Problem: Ischemic Stroke/TIA Tissue Perfusion: Goal: Complications of ischemic stroke/TIA will be minimized Outcome: Progressing   

## 2019-11-18 NOTE — Progress Notes (Signed)
VSS.  Patient discharged with all belongings accompanied by her her son.  Discharge instructions and AVS reviewed with son.  All questions answered.

## 2022-06-23 IMAGING — DX DG CHEST 1V PORT
1 series · 1 of 1 positions shown · non-contrast
Comparison: None.

CLINICAL DATA: Dizziness and confusion since 11 a.m.

EXAM:
PORTABLE CHEST 1 VIEW

[chest ap]
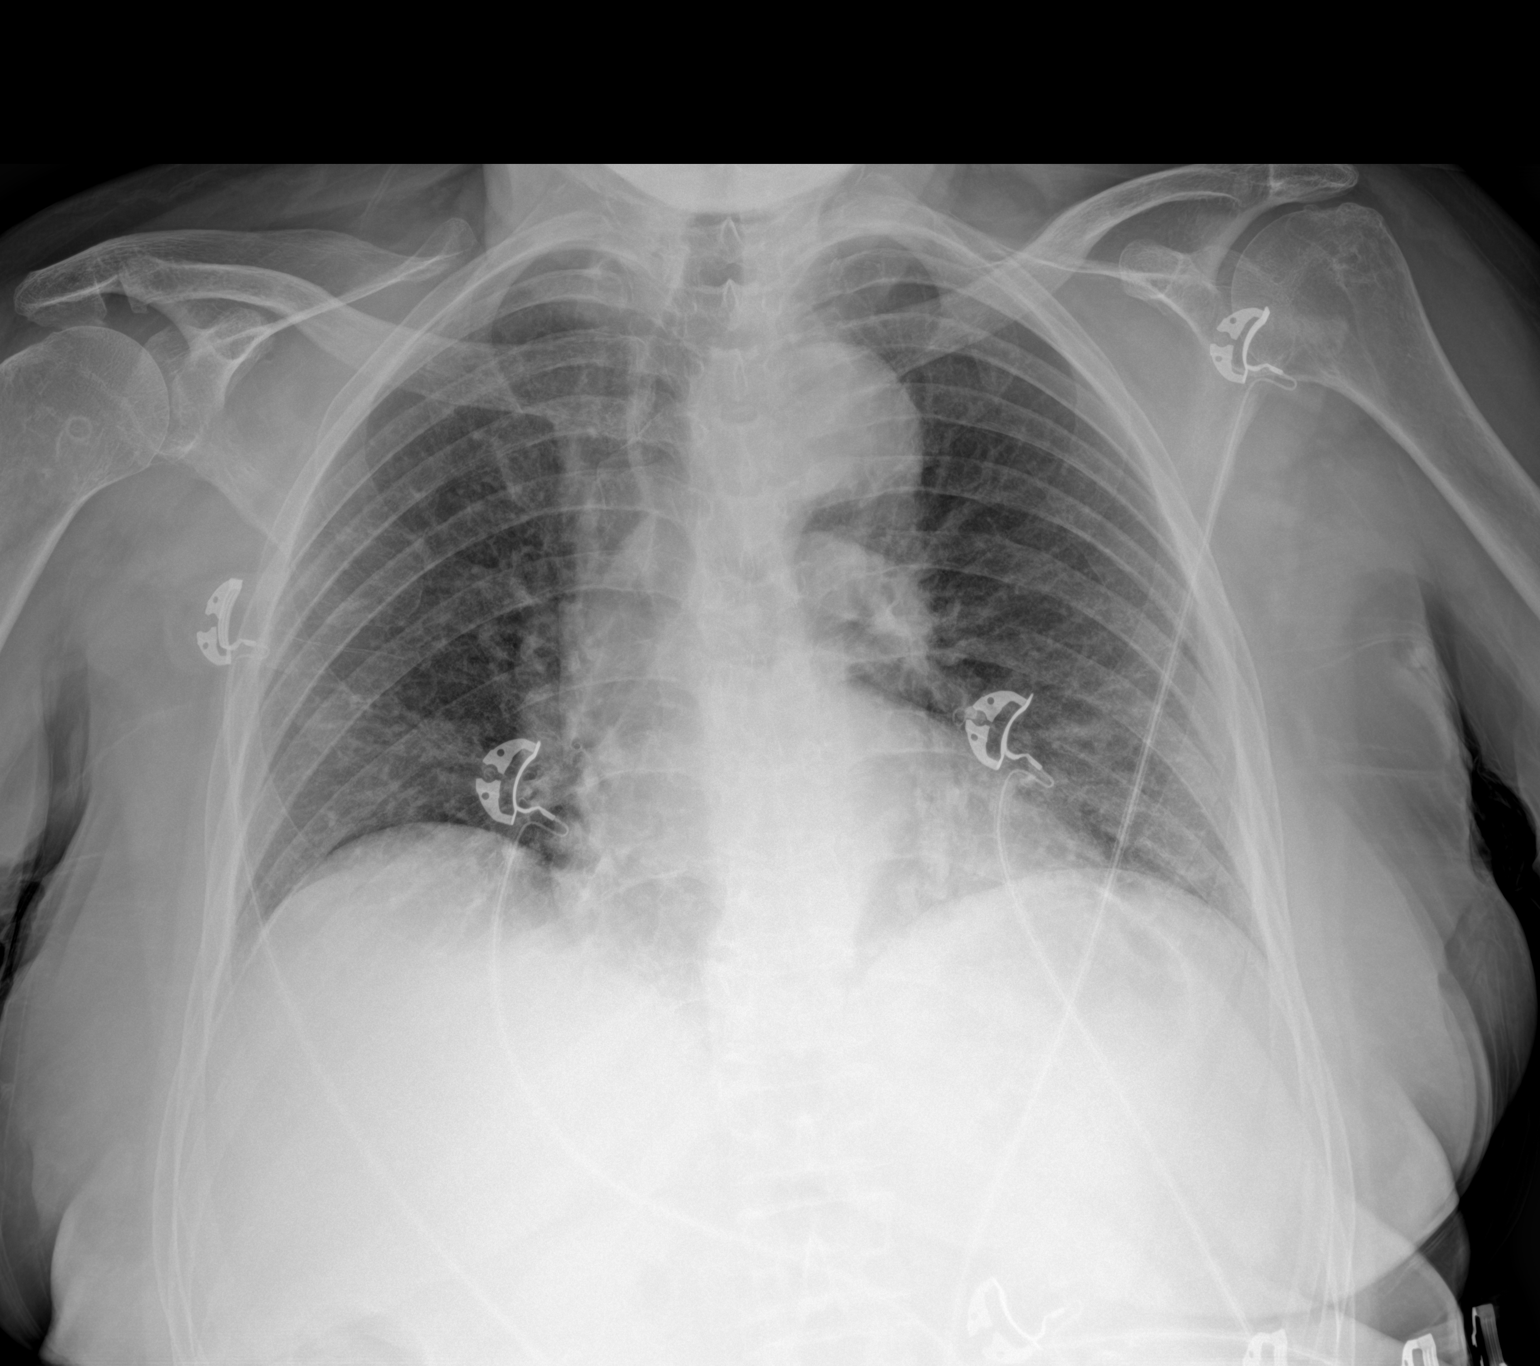

[1 of 1 positions shown; findings below may reference images not displayed]

FINDINGS: Single frontal view of the chest demonstrates an unremarkable
cardiac silhouette. There is ectasia of the thoracic aorta. No
airspace disease, effusion, or pneumothorax. No acute bony
abnormality.
IMPRESSION: 1. No acute intrathoracic process.
